# Patient Record
Sex: Male | Born: 1940 | Race: White | Hispanic: No | Marital: Married | Smoking: Former smoker
Health system: Southern US, Community
[De-identification: ages and names within clinical notes are randomized; demographics above are authoritative.]

## PROBLEM LIST (undated history)

## (undated) DIAGNOSIS — Z87442 Personal history of urinary calculi: Secondary | ICD-10-CM

## (undated) DIAGNOSIS — R0683 Snoring: Secondary | ICD-10-CM

## (undated) DIAGNOSIS — K219 Gastro-esophageal reflux disease without esophagitis: Secondary | ICD-10-CM

## (undated) DIAGNOSIS — I10 Essential (primary) hypertension: Secondary | ICD-10-CM

## (undated) DIAGNOSIS — E119 Type 2 diabetes mellitus without complications: Secondary | ICD-10-CM

## (undated) DIAGNOSIS — I499 Cardiac arrhythmia, unspecified: Secondary | ICD-10-CM

## (undated) DIAGNOSIS — N210 Calculus in bladder: Secondary | ICD-10-CM

## (undated) DIAGNOSIS — H9190 Unspecified hearing loss, unspecified ear: Secondary | ICD-10-CM

## (undated) DIAGNOSIS — Z974 Presence of external hearing-aid: Secondary | ICD-10-CM

## (undated) DIAGNOSIS — IMO0001 Reserved for inherently not codable concepts without codable children: Secondary | ICD-10-CM

## (undated) DIAGNOSIS — Z889 Allergy status to unspecified drugs, medicaments and biological substances status: Secondary | ICD-10-CM

## (undated) DIAGNOSIS — I38 Endocarditis, valve unspecified: Secondary | ICD-10-CM

## (undated) DIAGNOSIS — M48061 Spinal stenosis, lumbar region without neurogenic claudication: Secondary | ICD-10-CM

## (undated) DIAGNOSIS — M1990 Unspecified osteoarthritis, unspecified site: Secondary | ICD-10-CM

## (undated) DIAGNOSIS — J310 Chronic rhinitis: Secondary | ICD-10-CM

## (undated) HISTORY — PX: TONSILLECTOMY: SUR1361

## (undated) HISTORY — PX: BLADDER STONE REMOVAL: SHX568

## (undated) HISTORY — PX: LITHOTRIPSY: SUR834

## (undated) HISTORY — PX: CYSTOSCOPY: SUR368

## (undated) HISTORY — PX: OTHER SURGICAL HISTORY: SHX169

---

## 2000-07-03 ENCOUNTER — Encounter: Payer: Self-pay | Admitting: Neurosurgery

## 2000-07-03 ENCOUNTER — Ambulatory Visit (HOSPITAL_COMMUNITY): Admission: RE | Admit: 2000-07-03 | Discharge: 2000-07-03 | Payer: Self-pay | Admitting: Neurosurgery

## 2007-06-11 ENCOUNTER — Ambulatory Visit (HOSPITAL_BASED_OUTPATIENT_CLINIC_OR_DEPARTMENT_OTHER): Admission: RE | Admit: 2007-06-11 | Discharge: 2007-06-11 | Payer: Self-pay | Admitting: Urology

## 2010-06-14 NOTE — Op Note (Signed)
NAMERONN, SMOLINSKY             ACCOUNT NO.:  [id number]   MEDICAL RECORD NO.:  [id number]          PATIENT TYPE:  AMB   LOCATION:  [facility]                         FACILITY:  [facility]   PHYSICIAN:  Ronald L. Earlene Plater, M.D.  DATE OF BIRTH:  1940/12/12   DATE OF PROCEDURE:  06/11/2007  DATE OF DISCHARGE:                               OPERATIVE REPORT   PREOPERATIVE DIAGNOSIS:  Bladder stone.   POSTOPERATIVE DIAGNOSIS:  Bladder stone.   PROCEDURE PERFORMED:  Cystoscopy, cystolitholapaxy (EHL)   SURGEON:  Ronald L. Earlene Plater, M.D.   ASSISTANT:  Melina Schools, M.D.   ANESTHESIA:  General.   INDICATIONS FOR PROCEDURE:  Mr. Traynham is a 70 year old male who has a  history of microscopic hematuria.  He underwent cystoscopy which  revealed a 2.5 cm jackstone.  The patient did not want an open procedure  and therefore, an EHL cystolitholapaxy was offered.   DESCRIPTION OF PROCEDURE:  The patient brought to the operating room and  identified by his arm band. Informed consent was verified and  preoperative time-out performed.  After successful induction of general  anesthesia, the patient was moved to the dorsal lithotomy position where  all appropriate pressure points were padded to avoid compression  compartment syndrome.  The perineum was prepped and draped, the surgeons  were gowned and gloved.  A 25-French cystoscopic sheath was used to  introduce a 30 degree cystoscopic lens transurethrally into the bladder.  Pancystourethroscopy was performed.  Bilateral ureteral orifices were  noted to be in normal anatomic position along the trigone.  Each was  seen to efflux clear urine.  A 2 cm multifaceted jackstone was seen at  the base of the bladder.  It was fully mobile.  There are no associated  foreign objects.  Resectoscope was then inserted and the  electrohydraulic lithotrite was used to fragment the stone.  Once it was  sufficiently fragmented, the bladder was irrigated with Toomey syringe.  It was inspected and noted to be fragment free.  Foley catheter was  placed.  The procedure was terminated.     ______________________________  Melina Schools, MD      Lucrezia Starch. Earlene Plater, M.D.  Electronically Signed   JR/MEDQ  D:  06/11/2007  T:  06/11/2007  Job:  [id number]

## 2012-08-12 ENCOUNTER — Other Ambulatory Visit: Payer: Self-pay | Admitting: Urology

## 2012-08-13 ENCOUNTER — Encounter (HOSPITAL_COMMUNITY): Payer: Self-pay | Admitting: Pharmacy Technician

## 2012-08-14 ENCOUNTER — Ambulatory Visit (HOSPITAL_COMMUNITY)
Admission: RE | Admit: 2012-08-14 | Discharge: 2012-08-14 | Disposition: A | Discharge status: Home / Self Care | Payer: Medicare Other | Source: Ambulatory Visit | Attending: Urology | Admitting: Urology

## 2012-08-14 ENCOUNTER — Encounter (HOSPITAL_COMMUNITY)
Admission: RE | Admit: 2012-08-14 | Discharge: 2012-08-14 | Disposition: A | Discharge status: Home / Self Care | Payer: Medicare Other | Source: Ambulatory Visit | Attending: Urology | Admitting: Urology

## 2012-08-14 ENCOUNTER — Encounter (HOSPITAL_COMMUNITY): Payer: Self-pay

## 2012-08-14 DIAGNOSIS — N210 Calculus in bladder: Secondary | ICD-10-CM | POA: Insufficient documentation

## 2012-08-14 DIAGNOSIS — M47814 Spondylosis without myelopathy or radiculopathy, thoracic region: Secondary | ICD-10-CM | POA: Insufficient documentation

## 2012-08-14 DIAGNOSIS — D739 Disease of spleen, unspecified: Secondary | ICD-10-CM | POA: Insufficient documentation

## 2012-08-14 DIAGNOSIS — Z01818 Encounter for other preprocedural examination: Secondary | ICD-10-CM | POA: Insufficient documentation

## 2012-08-14 HISTORY — DX: Personal history of urinary calculi: Z87.442

## 2012-08-14 HISTORY — DX: Type 2 diabetes mellitus without complications: E11.9

## 2012-08-14 HISTORY — DX: Endocarditis, valve unspecified: I38

## 2012-08-14 HISTORY — DX: Essential (primary) hypertension: I10

## 2012-08-14 HISTORY — DX: Cardiac arrhythmia, unspecified: I49.9

## 2012-08-14 LAB — BASIC METABOLIC PANEL
Calcium: 10 mg/dL (ref 8.4–10.5)
Creatinine, Ser: 0.89 mg/dL (ref 0.50–1.35)
GFR calc Af Amer: >90 mL/min (ref >90)

## 2012-08-14 LAB — CBC
MCH: 31.2 pg (ref 26.0–34.0)
MCV: 92.8 fL (ref 78.0–100.0)
Platelets: 210 10*3/uL (ref 150–400)
RDW: 13.3 % (ref 11.5–15.5)

## 2012-08-14 NOTE — Progress Notes (Signed)
lov note and ekg dr Garner Nash [facility] on chart

## 2012-08-14 NOTE — Progress Notes (Signed)
02-01-2012 ekg/lov  dr Garner [facility] on chart

## 2012-08-14 NOTE — Progress Notes (Signed)
Pt allergic to cipro=muscle weakness, cipro ordered pre op.

## 2012-08-14 NOTE — Progress Notes (Signed)
Called Frank Hill and left message cipro =muscle weakness, cipro ordered pre op

## 2012-08-14 NOTE — Progress Notes (Signed)
08/14/12 1313  OBSTRUCTIVE SLEEP APNEA  Have you ever been diagnosed with sleep apnea through a sleep study? No  Do you snore loudly (loud enough to be heard through closed doors)?  1  Do you often feel tired, fatigued, or sleepy during the daytime? 0  Has anyone observed you stop breathing during your sleep? 1  Do you have, or are you being treated for high blood pressure? 1  BMI more than 35 kg/m2? 0  Age over 72 years old? 1  Neck circumference greater than 40 cm/18 inches? 0  Gender: 1  Obstructive Sleep Apnea Score 5  Score 4 or greater  Results sent to PCP

## 2012-08-14 NOTE — Progress Notes (Signed)
Faxed dr Annabell Howells  By epicccipro is muscle weakness, cipro ordered pre op

## 2012-08-14 NOTE — Patient Instructions (Addendum)
20 Brit Wernette  08/14/2012   Your procedure is scheduled on: 07-16-2012  Report to [facility] at 600 AM.  Call this number if you have problems the morning of surgery [phone]   Remember:   Do not eat food or drink liquids :After Midnight.     Take these medicines the morning of surgery with A SIP OF WATER: fluticasone nasal spray, sotalol, ranitidne                                SEE [facility] PREPARING FOR SURGERY SHEET   Do not wear jewelry, make-up or nail polish.  Do not wear lotions, powders, or perfumes. You may wear deodorant.   Men may shave face and neck.  Do not bring valuables to the hospital. [facility] IS NOT RESPONSIBLE FOR VALUEABLES.  Contacts, dentures or bridgework may not be worn into surgery.  Leave suitcase in the car. After surgery it may be brought to your room.  For patients admitted to the hospital, checkout time is 11:00 AM the day of discharge.   Patients discharged the day of surgery will not be allowed to drive home. Louise wife  Name and phone number of your [phone]  Special Instructions: N/A   Please read over the following fact sheets that you were given: MRSA Information.  Call Cain Sieve RN pre op nurse if needed [phone]    FAILURE TO FOLLOW THESE INSTRUCTIONS MAY RESULT IN THE CANCELLATION OF YOUR SURGERY.  PATIENT SIGNATURE___________________________________________  NURSE SIGNATURE_____________________________________________

## 2012-08-14 NOTE — H&P (Signed)
ctive Problems Problems  1. Benign Prostatic Hypertrophy 600.00 2. Bladder Calculus 594.1 3. 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 Proximal Ureteral Stone On The Left 592.1  History of Present Illness     Frank Hill is a former patient of Dr. Earlene Plater with a history of stones.  He also has BPH wtih some PSA elevation.  He presents today with the onset yesterday at 4:30 of intermittantly severe left flank pain with nausea and vomiting.  He has had several stones and past  some and have both ureteroscopy and ESWL.  Now he feels like the stone has passed into the bladder.  He has no pain or bladder symptoms now.  His stones have been Calcium and Uric acid.  The last was pure Ca Ox.   He was given Metformin at one point and he had multiple small stones.  He has not had a bad stone in 15 years but did have a 1in bladder stone in 2009.   He has a history of hypogonadism and was on TRT briefly and his PSA went up from around 2.9 to 3.4.   He stopped the med and it dropped to 3.0.  The most recent level was 3.3.  He has a history of prostatitis.  He had a biopsy 10-15 years ago for a PSA of 12 and it was negative.   Past Medical History Problems  1. History of  Anxiety (Symptom) 300.00 2. History of  Arthritis V13.4 3. History of  Diabetes Mellitus 250.00 4. History of  Dysuria 788.1 5. History of  Heartburn With Regurgitation 787.1 6. History of  Hypercholesterolemia 272.0 7. History of  Hypertension 401.9 8. History of  Prostatitis 601.9  Surgical History Problems  1. History of  Cystoscopy With Fragmentation Of Bladder Calculus Over 2.5cm 2. History of  Lithotripsy 3. History of  Tonsillectomy  Current Meds 1. Aspirin 81 MG Oral Tablet; Therapy: (Recorded:23Apr2009) to 2. Lisinopril TABS; Therapy: (Recorded:23Apr2009) to 3. Multi-Vitamin TABS; Therapy: (Recorded:23Apr2009) to 4. Ranitidine HCl 15 MG/ML Oral Syrup; Therapy: (Recorded:23Apr2009) to 5. Sotalol HCl 120 MG Oral  Tablet; Therapy: 02May2014 to  Allergies Medication  1. Cipro TABS 2. Penicillins  Family History Problems  1. Family history of  Death In The Family Father 2. Family history of  Death In The Family Mother 3. Maternal history of  Diabetes Mellitus V18.0 4. Family history of  Family Health Status - Father's Age Died at 78 from heart failure 5. Family history of  Family Health Status - Mother's Age Died at 58 from infection 6. Family history of  Family Health Status Number Of Children 1 son and daughter 59. Paternal history of  Heart Disease V17.49 8. Fraternal history of  Heart Disease V17.49 9. Paternal grandfather's history of  Stroke Syndrome V17.1  Social History Problems    Caffeine Use 4 cups a day   Chewing Nicotine-containing Substances   History of  Former Smoker   Marital History - Currently Married   Occupation: retired   Tobacco Use V15.82 chew tobacco Denied    History of  Alcohol Use   Past, family and social history reviewed and updated.   Review of Systems Genitourinary, constitutional, skin, eye, otolaryngeal, hematologic/lymphatic, cardiovascular, pulmonary, endocrine, musculoskeletal, gastrointestinal, neurological and psychiatric system(s) were reviewed and pertinent findings if present are noted.  Genitourinary: urinary frequency, feelings of urinary urgency, nocturia, incontinence, difficulty starting the urinary stream, urinary stream starts and stops and erectile dysfunction.  Gastrointestinal: nausea and vomiting.  Musculoskeletal:  joint pain.    Vitals Vital Signs [Data Includes: Last 1 Day]  14Jul2014 01:36PM  BMI Calculated: 29.62 BSA Calculated: 2.06 Height: 5 ft 9 in Weight: 200 lb  Blood Pressure: 148 / 82 Temperature: 98.9 F Heart Rate: 68  Physical Exam Constitutional: Well nourished and well developed . No acute distress.  ENT:. The ears and nose are normal in appearance.  Neck: The appearance of the neck is normal and  no neck mass is present.  Pulmonary: No respiratory distress and normal respiratory rhythm and effort.  Cardiovascular: Heart rate and rhythm are normal . No peripheral edema.  Abdomen: The abdomen is mildly obese. The abdomen is soft and nontender. No masses are palpated. No CVA tenderness. No hernias are palpable. No hepatosplenomegaly noted.  Rectal: Rectal exam demonstrates normal sphincter tone, no tenderness and no masses. Estimated prostate size is 3+. The prostate has no nodularity and is not tender. The left seminal vesicle is nonpalpable. The right seminal vesicle is nonpalpable. The perineum is normal on inspection.  Genitourinary: Examination of the penis demonstrates no discharge, no masses, no lesions and a normal meatus. The scrotum is without lesions. The right epididymis is palpably normal and non-tender. The left epididymis is palpably normal and non-tender. The right testis is non-tender and without masses. The left testis is non-tender and without masses.  Lymphatics: The femoral and inguinal nodes are not enlarged or tender.  Skin: Normal skin turgor, no visible rash and no visible skin lesions.  Neuro/Psych:. Mood and affect are appropriate. Normal sensation of the perineum/perianal region (S3,4,5).    Results/Data Urine [Data Includes: Last 1 Day]   14Jul2014  COLOR YELLOW   APPEARANCE CLEAR   SPECIFIC GRAVITY 1.025   pH 5.5   GLUCOSE NEG mg/dL  BILIRUBIN NEG   KETONE NEG mg/dL  BLOOD LARGE   PROTEIN TRACE mg/dL  UROBILINOGEN 0.2 mg/dL  NITRITE NEG   LEUKOCYTE ESTERASE NEG   SQUAMOUS EPITHELIAL/HPF RARE   WBC 0-2 WBC/hpf  RBC 7-10 RBC/hpf  BACTERIA FEW   CRYSTALS NONE SEEN   CASTS NONE SEEN    Old records or history reviewed: Prior office notes and records reviewed.  The following images/tracing/specimen were independently visualized:  KUB today shows a possible 8mm stone in the left proximal ureter but it could be bowel content. There is a 2cm jack stone in  the right bladder. The film is otherwise unremarkable. CT urogram confirms an 8x58mm left proximal stone with obstruction and a 2cm jack stone which is now in the left bladder. full report pending.    Assessment Assessed  1. Proximal Ureteral Stone On The Left 592.1 2. Bladder Calculus 594.1   He has an 8x35mm left proximal stone with obstruction and a 2.2cm Jack stone in the bladder.  He is assymptomatic at this time.   Plan Bladder Calculus (594.1), Proximal Ureteral Stone On The Left (592.1)  1. Follow-up Schedule Surgery Office  Follow-up  Done: 14Jul2014 Nephrolithiasis (592.0)  2. AU CT-STONE PROTOCOL  Done: 14Jul2014 12:00AM 3. KUB  Done: 14Jul2014 12:00AM Proximal Ureteral Stone On The Left (592.1)  4. Oxycodone-Acetaminophen 5-325 MG Oral Tablet; take 1 or 2 tablets q 4-6 hours prn pain;  Therapy: 14Jul2014 to (Last Rx:14Jul2014)   I will give him percocet to keep on hand. He will be set up for cystolithalopaxy and left ureteroscopic stone extraction with holmium and stent.  I reviewed the risks of bleeding, infection, ureteral and bladder injury, need for stent and secondary procedures,  thrombotic events and anesthetic risks.

## 2012-08-15 ENCOUNTER — Ambulatory Visit (HOSPITAL_COMMUNITY)
Admission: RE | Admit: 2012-08-15 | Discharge: 2012-08-15 | Disposition: A | Discharge status: Home / Self Care | Payer: Medicare Other | Source: Ambulatory Visit | Attending: Urology | Admitting: Urology

## 2012-08-15 ENCOUNTER — Encounter (HOSPITAL_COMMUNITY): Payer: Self-pay | Admitting: Anesthesiology

## 2012-08-15 ENCOUNTER — Encounter (HOSPITAL_COMMUNITY): Payer: Self-pay | Admitting: *Deleted

## 2012-08-15 ENCOUNTER — Ambulatory Visit (HOSPITAL_COMMUNITY): Payer: Medicare Other | Admitting: Anesthesiology

## 2012-08-15 ENCOUNTER — Encounter (HOSPITAL_COMMUNITY)
Admission: RE | Disposition: A | Discharge status: Home / Self Care | Payer: Self-pay | Source: Ambulatory Visit | Attending: Urology

## 2012-08-15 DIAGNOSIS — E7800 Pure hypercholesterolemia, unspecified: Secondary | ICD-10-CM | POA: Insufficient documentation

## 2012-08-15 DIAGNOSIS — Z7982 Long term (current) use of aspirin: Secondary | ICD-10-CM | POA: Insufficient documentation

## 2012-08-15 DIAGNOSIS — N529 Male erectile dysfunction, unspecified: Secondary | ICD-10-CM | POA: Insufficient documentation

## 2012-08-15 DIAGNOSIS — N201 Calculus of ureter: Secondary | ICD-10-CM | POA: Insufficient documentation

## 2012-08-15 DIAGNOSIS — N210 Calculus in bladder: Secondary | ICD-10-CM | POA: Insufficient documentation

## 2012-08-15 DIAGNOSIS — I10 Essential (primary) hypertension: Secondary | ICD-10-CM | POA: Insufficient documentation

## 2012-08-15 DIAGNOSIS — E119 Type 2 diabetes mellitus without complications: Secondary | ICD-10-CM | POA: Insufficient documentation

## 2012-08-15 DIAGNOSIS — N400 Enlarged prostate without lower urinary tract symptoms: Secondary | ICD-10-CM | POA: Insufficient documentation

## 2012-08-15 DIAGNOSIS — Z79899 Other long term (current) drug therapy: Secondary | ICD-10-CM | POA: Insufficient documentation

## 2012-08-15 HISTORY — PX: CYSTOSCOPY WITH RETROGRADE PYELOGRAM, URETEROSCOPY AND STENT PLACEMENT: SHX5789

## 2012-08-15 HISTORY — PX: HOLMIUM LASER APPLICATION: SHX5852

## 2012-08-15 LAB — GLUCOSE, CAPILLARY: Glucose-Capillary: 139 mg/dL — ABNORMAL HIGH (ref 70–99)

## 2012-08-15 SURGERY — CYSTOURETEROSCOPY, WITH RETROGRADE PYELOGRAM AND STENT INSERTION
Anesthesia: General | Laterality: Left | Wound class: Clean Contaminated

## 2012-08-15 MED ORDER — MIDAZOLAM HCL 5 MG/5ML IJ SOLN
INTRAMUSCULAR | Status: DC | PRN
  Administered 2012-08-15: 2 mg via INTRAVENOUS

## 2012-08-15 MED ORDER — SODIUM CHLORIDE 0.9 % IV SOLN: 250.0000 mL | INTRAVENOUS | Status: DC | PRN

## 2012-08-15 MED ORDER — FENTANYL CITRATE 0.05 MG/ML IJ SOLN
INTRAMUSCULAR | Status: DC | PRN
  Administered 2012-08-15: 25 ug via INTRAVENOUS
  Administered 2012-08-15: 50 ug via INTRAVENOUS
  Administered 2012-08-15: 25 ug via INTRAVENOUS
  Administered 2012-08-15: 50 ug via INTRAVENOUS

## 2012-08-15 MED ORDER — CIPROFLOXACIN IN D5W 400 MG/200ML IV SOLN: 400.0000 mg | INTRAVENOUS | Status: DC

## 2012-08-15 MED ORDER — 0.9 % SODIUM CHLORIDE (POUR BTL) OPTIME
TOPICAL | Status: DC | PRN
  Administered 2012-08-15: 1000 mL

## 2012-08-15 MED ORDER — HYOSCYAMINE SULFATE 0.125 MG SL SUBL: 0.1250 mg | SUBLINGUAL_TABLET | SUBLINGUAL | Status: DC | PRN

## 2012-08-15 MED ORDER — PROMETHAZINE HCL 25 MG/ML IJ SOLN: 6.2500 mg | INTRAMUSCULAR | Status: DC | PRN

## 2012-08-15 MED ORDER — PHENAZOPYRIDINE HCL 100 MG PO TABS
100.0000 mg | ORAL_TABLET | ORAL | Status: AC
  Administered 2012-08-15: 100 mg via ORAL
  Filled 2012-08-15: qty 1

## 2012-08-15 MED ORDER — IOHEXOL 300 MG/ML  SOLN
INTRAMUSCULAR | Status: DC | PRN
  Administered 2012-08-15: 50 mL

## 2012-08-15 MED ORDER — ONDANSETRON HCL 4 MG/2ML IJ SOLN: 4.0000 mg | Freq: Four times a day (QID) | INTRAMUSCULAR | Status: DC | PRN

## 2012-08-15 MED ORDER — BELLADONNA ALKALOIDS-OPIUM 16.2-60 MG RE SUPP
RECTAL | Status: AC
  Filled 2012-08-15: qty 1

## 2012-08-15 MED ORDER — IOHEXOL 300 MG/ML  SOLN
INTRAMUSCULAR | Status: AC
  Filled 2012-08-15: qty 1

## 2012-08-15 MED ORDER — ACETAMINOPHEN 650 MG RE SUPP
650.0000 mg | RECTAL | Status: DC | PRN
  Filled 2012-08-15: qty 1

## 2012-08-15 MED ORDER — HYDROMORPHONE HCL PF 1 MG/ML IJ SOLN
0.2500 mg | INTRAMUSCULAR | Status: DC | PRN
  Administered 2012-08-15: 0.5 mg via INTRAVENOUS

## 2012-08-15 MED ORDER — HYDROMORPHONE HCL PF 1 MG/ML IJ SOLN
INTRAMUSCULAR | Status: AC
  Filled 2012-08-15: qty 1

## 2012-08-15 MED ORDER — SODIUM CHLORIDE 0.9 % IJ SOLN: 3.0000 mL | Freq: Two times a day (BID) | INTRAMUSCULAR | Status: DC

## 2012-08-15 MED ORDER — ACETAMINOPHEN 325 MG PO TABS: 650.0000 mg | ORAL_TABLET | ORAL | Status: DC | PRN

## 2012-08-15 MED ORDER — BELLADONNA ALKALOIDS-OPIUM 16.2-60 MG RE SUPP
RECTAL | Status: DC | PRN
  Administered 2012-08-15: 1 via RECTAL

## 2012-08-15 MED ORDER — CEFAZOLIN SODIUM-DEXTROSE 2-3 GM-% IV SOLR
INTRAVENOUS | Status: AC
  Filled 2012-08-15: qty 50

## 2012-08-15 MED ORDER — ONDANSETRON HCL 4 MG/2ML IJ SOLN
INTRAMUSCULAR | Status: DC | PRN
  Administered 2012-08-15: 4 mg via INTRAVENOUS

## 2012-08-15 MED ORDER — PHENYLEPHRINE HCL 10 MG/ML IJ SOLN
20.0000 mg | INTRAVENOUS | Status: DC | PRN
  Administered 2012-08-15: 15 ug/min via INTRAVENOUS

## 2012-08-15 MED ORDER — PHENYLEPHRINE HCL 10 MG/ML IJ SOLN
INTRAMUSCULAR | Status: DC | PRN
  Administered 2012-08-15: 40 ug via INTRAVENOUS
  Administered 2012-08-15: 80 ug via INTRAVENOUS

## 2012-08-15 MED ORDER — SUCCINYLCHOLINE CHLORIDE 20 MG/ML IJ SOLN
INTRAMUSCULAR | Status: DC | PRN
  Administered 2012-08-15: 100 mg via INTRAVENOUS

## 2012-08-15 MED ORDER — FENTANYL CITRATE 0.05 MG/ML IJ SOLN: 25.0000 ug | INTRAMUSCULAR | Status: DC | PRN

## 2012-08-15 MED ORDER — LIDOCAINE HCL (CARDIAC) 20 MG/ML IV SOLN
INTRAVENOUS | Status: DC | PRN
  Administered 2012-08-15: 100 mg via INTRAVENOUS

## 2012-08-15 MED ORDER — SODIUM CHLORIDE 0.9 % IJ SOLN: 3.0000 mL | INTRAMUSCULAR | Status: DC | PRN

## 2012-08-15 MED ORDER — PROPOFOL 10 MG/ML IV BOLUS
INTRAVENOUS | Status: DC | PRN
  Administered 2012-08-15: 50 mg via INTRAVENOUS
  Administered 2012-08-15: 40 mg via INTRAVENOUS
  Administered 2012-08-15: 180 mg via INTRAVENOUS

## 2012-08-15 MED ORDER — LACTATED RINGERS IV SOLN: INTRAVENOUS | Status: DC

## 2012-08-15 MED ORDER — CEFAZOLIN SODIUM-DEXTROSE 2-3 GM-% IV SOLR
2.0000 g | INTRAVENOUS | Status: AC
  Administered 2012-08-15: 2 g via INTRAVENOUS

## 2012-08-15 MED ORDER — PHENAZOPYRIDINE HCL 200 MG PO TABS: 200.0000 mg | ORAL_TABLET | Freq: Three times a day (TID) | ORAL | Status: DC | PRN

## 2012-08-15 MED ORDER — OXYCODONE HCL 5 MG PO TABS: 5.0000 mg | ORAL_TABLET | ORAL | Status: DC | PRN

## 2012-08-15 MED ORDER — SODIUM CHLORIDE 0.9 % IR SOLN
Status: DC | PRN
  Administered 2012-08-15: 3000 mL
  Administered 2012-08-15: 6000 mL

## 2012-08-15 MED ORDER — EPHEDRINE SULFATE 50 MG/ML IJ SOLN
INTRAMUSCULAR | Status: DC | PRN
  Administered 2012-08-15: 5 mg via INTRAVENOUS

## 2012-08-15 MED ORDER — GLYCOPYRROLATE 0.2 MG/ML IJ SOLN
INTRAMUSCULAR | Status: DC | PRN
  Administered 2012-08-15: 0.2 mg via INTRAVENOUS

## 2012-08-15 MED ORDER — SOTALOL HCL 120 MG PO TABS
120.0000 mg | ORAL_TABLET | Freq: Two times a day (BID) | ORAL | Status: DC
  Administered 2012-08-15: 80 mg via ORAL
  Administered 2012-08-15: 07:00:00 via ORAL
  Administered 2012-08-15: 80 mg via ORAL
  Filled 2012-08-15: qty 1

## 2012-08-15 MED ORDER — LACTATED RINGERS IV SOLN
INTRAVENOUS | Status: DC | PRN
  Administered 2012-08-15: 08:00:00 via INTRAVENOUS

## 2012-08-15 SURGICAL SUPPLY — 21 items
BAG URO CATCHER STRL LF (DRAPE) ×2 IMPLANT
BASKET ZERO TIP NITINOL 2.4FR (BASKET) ×1 IMPLANT
BSKT STON RTRVL ZERO TP 2.4FR (BASKET) ×1
CATH URET 5FR 28IN OPEN ENDED (CATHETERS) ×2 IMPLANT
CLOTH BEACON ORANGE TIMEOUT ST (SAFETY) ×2 IMPLANT
DRAPE CAMERA CLOSED 9X96 (DRAPES) ×2 IMPLANT
FIBER LASER TRAC TIP (UROLOGICAL SUPPLIES) IMPLANT
GLOVE SURG SS PI 8.0 STRL IVOR (GLOVE) ×2 IMPLANT
GOWN PREVENTION PLUS XLARGE (GOWN DISPOSABLE) ×1 IMPLANT
GOWN STRL REIN XL XLG (GOWN DISPOSABLE) ×3 IMPLANT
GUIDEWIRE STR DUAL SENSOR (WIRE) ×2 IMPLANT
LASER FIBER DISP (UROLOGICAL SUPPLIES) ×1 IMPLANT
LASER FIBER DISP 1000U (UROLOGICAL SUPPLIES) ×1 IMPLANT
MANIFOLD NEPTUNE II (INSTRUMENTS) ×2 IMPLANT
MARKER SKIN DUAL TIP RULER LAB (MISCELLANEOUS) ×2 IMPLANT
PACK CYSTO (CUSTOM PROCEDURE TRAY) ×2 IMPLANT
SHEATH ACCESS URETERAL 38CM (SHEATH) ×1 IMPLANT
STENT CONTOUR 6FRX26X.038 (STENTS) ×1 IMPLANT
SYR 50ML LL SCALE MARK (SYRINGE) ×1 IMPLANT
SYRINGE 10CC LL (SYRINGE) ×1 IMPLANT
TUBING CONNECTING 10 (TUBING) ×2 IMPLANT

## 2012-08-15 NOTE — Op Note (Signed)
NAMEDARVIS, CROFT NO.:  [id number]  MEDICAL RECORD NO.:  [id number]  LOCATION:  [facility]                         FACILITY:  [facility]  PHYSICIAN:  Excell Seltzer. Annabell Howells, M.D.    DATE OF BIRTH:  [birth year]  DATE OF PROCEDURE:  08/15/2012 DATE OF DISCHARGE:  08/15/2012                              OPERATIVE REPORT   PROCEDURE:  Cystolitholapaxy for a 1.7 cm bladder stone and left retrograde pyelogram with interpretation, left  ureteroscopy with holmium lasertripsy, stone extraction, and left ureteral stent insertion for a 1.2 cm left proximal ureteral stone.  PREOPERATIVE DIAGNOSIS:  A 1.7 cm bladder stone and 1.2 cm left proximal ureteral stone.  POSTOPERATIVE DIAGNOSIS:  A 1.7 cm bladder stone and 1.2 cm left proximal ureteral stone.  PROCEDURE:  Left retrograde pyelogram and interpretation.  SURGEON:  Excell Seltzer. Annabell Howells, MD  ANESTHESIA:  General.  SPECIMEN:  Stone fragments.  BLOOD LOSS:  Minimal.  DRAINS:  A 6-French 26-cm left double-J stent.  COMPLICATIONS:  None.  INDICATIONS:  Mr. Perfect is a 72 year old white male with a symptomatic 1.2 cm left proximal ureteral stone and a 1.7 cm bladder stone.  It was felt that cystolitholapaxy and ureteroscopic stone extraction were indicated.  FINDINGS OF PROCEDURE:  He was taken to the operating room, where general anesthetic was induced.  He was given 2 g of Ancef.  He was placed in lithotomy position and fitted with PAS hose.  His perineum and genitalia were prepped with Betadine solution and was draped in usual sterile fashion.  Cystoscopy was performed using a 22-French scope and 12 and 70 degree lenses.  Examination revealed a normal urethra.  The external sphincter was intact.  The prostatic urethra was approximately 3 cm in length with trilobar hyperplasia with obstruction.  The middle lobe was small.  The bladder had mild trabeculation.  No tumors were noted.  Ureteral orifices were unremarkable, but there  was a 1.7 cm jack stone at the base of the bladder.  Once inspection had been performed, a 1000 micron laser fiber was passed through the scope.  Holmium laser was initially set on 1 joule and 5 hertz that was increased at later point to 2 joule and 5 hertz.  The stone was fragmented into manageable pieces which were then evacuated from the bladder.  Once all the bladder stone fragments had been removed, a 5-French open- end catheter was placed through the cystoscope and a left retrograde pyelogram was performed.  The contrast was instilled.   This revealed slight J hooking of the distal ureter with an otherwise normal caliber ureter up to the stone at approximately L4.  Contrast would not readily flow by the stone.  At this point, a guidewire was passed through the open end catheter and was negotiated by the stone into the kidney.  A 12-French introducer sheath dilator core was placed over the wire to dilate the ureter to the level of the stone that was removed and a 6.4-French short ureteroscope was then passed alongside the wire.  I was not able to get above the distal ureter due to an S shaped ureter.  At this point, a 2nd guidewire was passed and  advanced by the stone and the 38 cm digital access sheath was reassembled and inserted over the wire to just below the stone.  The inner core and wire were removed and the digital flexible ureteroscope was then inserted to the level of the kidney.  The kidney could be visualized.  There was a little kink right at the stone which made it somewhat more difficult, but I was able to appropriately visualize the stone.  Initially I used the smooth tip 200 micron laser fiber, but the tip of the fiber appeared to be laminated. This was removed and then I went back to the regular 200 micron fiber set on 0.5 joules and 10 hertz.  At this setting, the stone readily fragmented.  It was broken up into manageable pieces and  intermittently fragments would be removed with a ZeroTip basket.  Eventually the stone fragmented to point where I could pass the scope above the stone into the proximal ureter and kidney.  Once this was done, the residual fragments removed with the basket.  Final inspection revealed some small residual fragments that were felt to be sufficiently small to pass. There was some irritation to the ureter at the level of the stone.  It was felt a stent was indicated.  The access sheath was then removed.  The cystoscope was reinserted over the wire and a 6-French 26 cm double-J stent was passed to the kidney under fluoroscopic guidance.  The wire was removed leaving good coil in the kidney and good coil in the bladder.  Spot film revealed no obvious calcifications in the kidney or proximal ureter.  The bladder was drained.  The scope was removed.  The patient was taken down from lithotomy position after placement of a B and O suppository.  His anesthetic was reversed and he was moved to recovery room in stable condition.  There were no complications.     Excell Seltzer. Annabell Howells, M.D.     JJW/MEDQ  D:  08/15/2012  T:  08/15/2012  Job:  [id number]

## 2012-08-15 NOTE — Transfer of Care (Signed)
Immediate Anesthesia Transfer of Care Note  Patient: Frank Hill  Procedure(s) Performed: Procedure(s): CYSTOSCOPY LITHIOPEXY, URETEROSCOPY STONE EXTRACTION WITH HOLMIUM LASER AND STENT PLACEMENT (Left) HOLMIUM LASER APPLICATION (Left)  Patient Location: PACU  Anesthesia Type:General  Level of Consciousness: awake, alert , oriented and patient cooperative  Airway & Oxygen Therapy: Patient Spontanous Breathing and Patient connected to face mask oxygen  Post-op Assessment: Report given to PACU RN, Post -op Vital signs reviewed and stable and Patient moving all extremities  Post vital signs: Reviewed and stable  Complications: No apparent anesthesia complications

## 2012-08-15 NOTE — Interval H&P Note (Signed)
History and Physical Interval Note:  08/15/2012 8:17 AM  Frank Hill  has presented today for surgery, with the diagnosis of BLADDER STONE AND LEFT PROXIMAL STONE  The various methods of treatment have been discussed with the patient and family. After consideration of risks, benefits and other options for treatment, the patient has consented to  Procedure(s): CYSTOSCOPY LITHIOPEXY, URETEROSCOPY STONE EXTRACTION WITH HOLMIUM LASER AND STENT PLACEMENT (Left) HOLMIUM LASER APPLICATION (Left) as a surgical intervention .  The patient's history has been reviewed, patient examined, no change in status, stable for surgery.  I have reviewed the patient's chart and labs.  Questions were answered to the patient's satisfaction.     Kasyn Stouffer J

## 2012-08-15 NOTE — Anesthesia Postprocedure Evaluation (Signed)
Anesthesia Post Note  Patient: Frank Hill  Procedure(s) Performed: Procedure(s) (LRB): CYSTOSCOPY LITHIOPEXY, URETEROSCOPY STONE EXTRACTION WITH HOLMIUM LASER AND STENT PLACEMENT (Left) HOLMIUM LASER APPLICATION (Left)  Anesthesia type: General  Patient location: PACU  Post pain: Pain level controlled  Post assessment: Post-op Vital signs reviewed  Last Vitals:  Filed Vitals:   08/15/12 1138  BP: 153/73  Pulse: 65  Temp: 36.3 C  Resp: 12    Post vital signs: Reviewed  Level of consciousness: sedated  Complications: No apparent anesthesia complications

## 2012-08-15 NOTE — Anesthesia Preprocedure Evaluation (Addendum)
Anesthesia Evaluation  Patient identified by MRN, date of birth, ID band Patient awake    Reviewed: Allergy & Precautions, H&P , NPO status , Patient's Chart, lab work & pertinent test results  History of Anesthesia Complications (+) AWARENESS UNDER ANESTHESIA  Airway Mallampati: II TM Distance: >3 FB Neck ROM: Full    Dental  (+) Teeth Intact, Chipped and Dental Advisory Given   Pulmonary former smoker,  breath sounds clear to auscultation  Pulmonary exam normal       Cardiovascular hypertension, Pt. on medications + dysrhythmias Atrial Fibrillation Rhythm:Regular Rate:Normal     Neuro/Psych negative neurological ROS  negative psych ROS   GI/Hepatic negative GI ROS, Neg liver ROS,   Endo/Other  diabetes (Diet control)  Renal/GU negative Renal ROS  negative genitourinary   Musculoskeletal negative musculoskeletal ROS (+)   Abdominal   Peds negative pediatric ROS (+)  Hematology negative hematology ROS (+)   Anesthesia Other Findings   Reproductive/Obstetrics                       Anesthesia Physical Anesthesia Plan  ASA: III  Anesthesia Plan: General   Post-op Pain Management:    Induction: Intravenous  Airway Management Planned: LMA  Additional Equipment:   Intra-op Plan:   Post-operative Plan: Extubation in OR  Informed Consent: I have reviewed the patients History and Physical, chart, labs and discussed the procedure including the risks, benefits and alternatives for the proposed anesthesia with the patient or authorized representative who has indicated his/her understanding and acceptance.   Dental advisory given  Plan Discussed with: CRNA  Anesthesia Plan Comments:         Anesthesia Quick Evaluation

## 2012-08-15 NOTE — Brief Op Note (Signed)
08/15/2012  10:33 AM  PATIENT:  Frank Hill  72 y.o. male  PRE-OPERATIVE DIAGNOSIS:  BLADDER STONE AND LEFT PROXIMAL STONE  POST-OPERATIVE DIAGNOSIS:  BLADDER STONE AND LEFT PROXIMAL STONE  PROCEDURE:  Procedure(s): CYSTOSCOPY LITHIOPEXY, URETEROSCOPY STONE EXTRACTION WITH HOLMIUM LASER AND STENT PLACEMENT (Left) HOLMIUM LASER APPLICATION (Left)  SURGEON:  Surgeon(s) and Role:    * Anner Crete, MD - Primary  PHYSICIAN ASSISTANT:   ASSISTANTS: none   ANESTHESIA:   general  EBL:  Total I/O In: 1000 [I.V.:1000] Out: -   BLOOD ADMINISTERED:none  DRAINS: 6x26 left JJ stent   LOCAL MEDICATIONS USED:  NONE  SPECIMEN:  Source of Specimen:  bladder and ureteral stone fragments.   DISPOSITION OF SPECIMEN:  to family  COUNTS:  YES  TOURNIQUET:  * No tourniquets in log *  DICTATION: .Other Dictation: Dictation Number [id number]  PLAN OF CARE: Discharge to home after PACU  PATIENT DISPOSITION:  PACU - hemodynamically stable.   Delay start of Pharmacological VTE agent (>24hrs) due to surgical blood loss or risk of bleeding: not applicable

## 2012-08-15 NOTE — Preoperative (Signed)
Beta Blockers   Reason not to administer Beta Blockers:Betapace taken 08-15-12 at [facility]

## 2012-08-16 ENCOUNTER — Emergency Department (HOSPITAL_BASED_OUTPATIENT_CLINIC_OR_DEPARTMENT_OTHER)
Admission: EM | Admit: 2012-08-16 | Discharge: 2012-08-16 | Disposition: A | Discharge status: Home / Self Care | Payer: Medicare Other | Attending: Emergency Medicine | Admitting: Emergency Medicine

## 2012-08-16 ENCOUNTER — Encounter (HOSPITAL_BASED_OUTPATIENT_CLINIC_OR_DEPARTMENT_OTHER): Payer: Self-pay | Admitting: *Deleted

## 2012-08-16 DIAGNOSIS — Z7982 Long term (current) use of aspirin: Secondary | ICD-10-CM | POA: Insufficient documentation

## 2012-08-16 DIAGNOSIS — Z87442 Personal history of urinary calculi: Secondary | ICD-10-CM | POA: Insufficient documentation

## 2012-08-16 DIAGNOSIS — R34 Anuria and oliguria: Secondary | ICD-10-CM | POA: Insufficient documentation

## 2012-08-16 DIAGNOSIS — Z79899 Other long term (current) drug therapy: Secondary | ICD-10-CM | POA: Insufficient documentation

## 2012-08-16 DIAGNOSIS — I10 Essential (primary) hypertension: Secondary | ICD-10-CM | POA: Insufficient documentation

## 2012-08-16 DIAGNOSIS — Z87891 Personal history of nicotine dependence: Secondary | ICD-10-CM | POA: Insufficient documentation

## 2012-08-16 DIAGNOSIS — N3289 Other specified disorders of bladder: Secondary | ICD-10-CM | POA: Insufficient documentation

## 2012-08-16 DIAGNOSIS — Z9889 Other specified postprocedural states: Secondary | ICD-10-CM | POA: Insufficient documentation

## 2012-08-16 DIAGNOSIS — R350 Frequency of micturition: Secondary | ICD-10-CM | POA: Insufficient documentation

## 2012-08-16 DIAGNOSIS — Z880 Allergy status to penicillin: Secondary | ICD-10-CM | POA: Insufficient documentation

## 2012-08-16 DIAGNOSIS — Z8679 Personal history of other diseases of the circulatory system: Secondary | ICD-10-CM | POA: Insufficient documentation

## 2012-08-16 DIAGNOSIS — E119 Type 2 diabetes mellitus without complications: Secondary | ICD-10-CM | POA: Insufficient documentation

## 2012-08-16 LAB — URINALYSIS, ROUTINE W REFLEX MICROSCOPIC
Glucose, UA: NEGATIVE mg/dL
Nitrite: POSITIVE — AB
Protein, ur: 100 mg/dL — AB
pH: 6 (ref 5.0–8.0)

## 2012-08-16 LAB — URINE MICROSCOPIC-ADD ON

## 2012-08-16 MED ORDER — SULFAMETHOXAZOLE-TRIMETHOPRIM 800-160 MG PO TABS: 1.0000 | ORAL_TABLET | Freq: Two times a day (BID) | ORAL | Status: DC

## 2012-08-16 MED ORDER — BELLADONNA-OPIUM 16.2-30 MG RE SUPP: 30.0000 mg | Freq: Three times a day (TID) | RECTAL | Status: DC | PRN

## 2012-08-16 NOTE — Discharge Instructions (Signed)
Call Doctor Wrenn's office today to possibly be seen in the office later today. If you develop fever, nausea or vomiting, increased pain, return to the ER.   Possible Urinary Tract Infection Urinary tract infections (UTIs) can develop anywhere along your urinary tract. Your urinary tract is your body's drainage system for removing wastes and extra water. Your urinary tract includes two kidneys, two ureters, a bladder, and a urethra. Your kidneys are a pair of bean-shaped organs. Each kidney is about the size of your fist. They are located below your ribs, one on each side of your spine. CAUSES Infections are caused by microbes, which are microscopic organisms, including fungi, viruses, and bacteria. These organisms are so small that they can only be seen through a microscope. Bacteria are the microbes that most commonly cause UTIs. SYMPTOMS  Symptoms of UTIs may vary by age and gender of the patient and by the location of the infection. Symptoms in young women typically include a frequent and intense urge to urinate and a painful, burning feeling in the bladder or urethra during urination. Older women and men are more likely to be tired, shaky, and weak and have muscle aches and abdominal pain. A fever may mean the infection is in your kidneys. Other symptoms of a kidney infection include pain in your back or sides below the ribs, nausea, and vomiting. DIAGNOSIS To diagnose a UTI, your caregiver will ask you about your symptoms. Your caregiver also will ask to provide a urine sample. The urine sample will be tested for bacteria and white blood cells. White blood cells are made by your body to help fight infection. TREATMENT  Typically, UTIs can be treated with medication. Because most UTIs are caused by a bacterial infection, they usually can be treated with the use of antibiotics. The choice of antibiotic and length of treatment depend on your symptoms and the type of bacteria causing your  infection. HOME CARE INSTRUCTIONS  If you were prescribed antibiotics, take them exactly as your caregiver instructs you. Finish the medication even if you feel better after you have only taken some of the medication.  Drink enough water and fluids to keep your urine clear or pale yellow.  Avoid caffeine, tea, and carbonated beverages. They tend to irritate your bladder.  Empty your bladder often. Avoid holding urine for long periods of time.  Empty your bladder before and after sexual intercourse.  After a bowel movement, women should cleanse from front to back. Use each tissue only once. SEEK MEDICAL CARE IF:   You have back pain.  You develop a fever.  Your symptoms do not begin to resolve within 3 days. SEEK IMMEDIATE MEDICAL CARE IF:   You have severe back pain or lower abdominal pain.  You develop chills.  You have nausea or vomiting.  You have continued burning or discomfort with urination. MAKE SURE YOU:   Understand these instructions.  Will watch your condition.  Will get help right away if you are not doing well or get worse. Document Released: 10/26/2004 Document Revised: 07/18/2011 Document Reviewed: 02/24/2011 [facility] Patient Information 2014 [place], [place].

## 2012-08-16 NOTE — ED Provider Notes (Signed)
History    CSN: [id number] Arrival date & time 08/16/12  0713  First MD Initiated Contact with Patient 08/16/12 [phone]     Chief Complaint  Patient presents with  . unable to void, surgery yesterday    (Consider location/radiation/quality/duration/timing/severity/associated sxs/prior Treatment) HPI Comments: Patient presents to ER for evaluation of possible urinary retention. Patient reports that he had a left ureteral stent placed yesterday I was in the hospital for kidney stone. Patient says that he has not been able to urinate. In actuality, however, patient has been urinating frequently. He has been urinating small amounts every 15 minutes through the night. No fever, nausea or vomiting. He has noticed occasional blood in the urine.  Past Medical History  Diagnosis Date  . Hypertension   . Diabetes mellitus without complication     no current meds  . History of kidney stones   . Endocarditis 2-3 yrs ago  . Dysrhythmia     atrial flutter   Past Surgical History  Procedure Laterality Date  . Bladder stone removal  yrs ago  . Cystoscopy  yrs agoseveral done  . Lithotripsy  15 yrs ago    several done  . Cystolithopaxy  yrs ago  . Tonsillectomy  age 70   History reviewed. No pertinent family history. History  Substance Use Topics  . Smoking status: Former Smoker -- 1.00 packs/day for 15 years    Types: Cigarettes    Quit date: 01/30/1978  . Smokeless tobacco: Current User    Types: Chew, Snuff  . Alcohol Use: No    Review of Systems  Constitutional: Negative for fever.  Genitourinary: Positive for frequency and decreased urine volume. Negative for flank pain.  All other systems reviewed and are negative.    Allergies  Ciprofloxacin; Prednisone; Adhesive; and Penicillins  Home Medications   Current Outpatient Rx  Name  Route  Sig  Dispense  Refill  . Alpha-Lipoic Acid 200 MG TABS   Oral   Take 1 tablet by mouth 2 (two) times daily.         . AMINO ACIDS  COMPLEX PO   Oral   Take 1 tablet by mouth 2 (two) times daily.         . Ascorbic Acid (VITAMIN C) 1000 MG tablet   Oral   Take 1,000 mg by mouth daily.         Marland Kitchen aspirin EC 81 MG tablet   Oral   Take 81 mg by mouth daily.         . Calcium Carbonate-Simethicone (ROLAIDS MULTI-SYMPTOM PO)   Oral   Take 1 tablet by mouth every 6 (six) hours as needed (heartburn).         . calcium-vitamin D (OSCAL WITH D) 500-200 MG-UNIT per tablet   Oral   Take 2 tablets by mouth 2 (two) times daily.         . cholecalciferol (VITAMIN D) 1000 UNITS tablet   Oral   Take 1,000 Units by mouth daily.         . Chromium 200 MCG TABS   Oral   Take 200 mcg by mouth daily.         . Coenzyme Q10 (CO Q 10) 100 MG CAPS   Oral   Take 1 capsule by mouth 2 (two) times daily.         . fish oil-omega-3 fatty acids 1000 MG capsule   Oral   Take 2 g by mouth 2 (  two) times daily.         . fluticasone (FLONASE) 50 MCG/ACT nasal spray   Nasal   Place 2 sprays into the nose 2 (two) times daily.         . Garlic 200 MG TABS   Oral   Take 400 mg by mouth 2 (two) times daily.         . Ginkgo Biloba 120 MG TABS   Oral   Take 120 mg by mouth daily.         . Glucosamine Sulfate 1000 MG CAPS   Oral   Take 1,000 mg by mouth 2 (two) times daily.         . hyoscyamine (LEVSIN/SL) 0.125 MG SL tablet   Sublingual   Place 1 tablet (0.125 mg total) under the tongue every 4 (four) hours as needed for cramping.   30 tablet   0   . Lactobacillus (ACIDOPHILUS PO)   Oral   Take 1 tablet by mouth daily.         Marland Kitchen LECITHIN PO   Oral   Take 1 tablet by mouth 2 (two) times daily.         Marland Kitchen lisinopril (PRINIVIL,ZESTRIL) 20 MG tablet   Oral   Take 20 mg by mouth 2 (two) times daily.         . Methylsulfonylmethane (MSM) 1000 MG TABS   Oral   Take 1 tablet by mouth 2 (two) times daily.         . Multiple Vitamin (MULTIVITAMIN WITH MINERALS) TABS   Oral   Take 1  tablet by mouth daily.         . niacin (SLO-NIACIN) 500 MG tablet   Oral   Take 500 mg by mouth at bedtime.         Marland Kitchen OVER THE COUNTER MEDICATION   Oral   Take 1 tablet by mouth daily. BILLBERRY 375 MG         . oxyCODONE-acetaminophen (PERCOCET/ROXICET) 5-325 MG per tablet   Oral   Take 2 tablets by mouth every 4 (four) hours as needed for pain.         . phenazopyridine (PYRIDIUM) 200 MG tablet   Oral   Take 1 tablet (200 mg total) by mouth 3 (three) times daily as needed for pain.   30 tablet   0   . Psyllium Husk POWD   Oral   Take 60 mg by mouth at bedtime.         . ranitidine (ZANTAC) 300 MG capsule   Oral   Take 300 mg by mouth 2 (two) times daily.         . Selenium 200 MCG TABS   Oral   Take 1 tablet by mouth daily.         . sildenafil (VIAGRA) 50 MG tablet   Oral   Take 50 mg by mouth daily as needed for erectile dysfunction.         . simvastatin (ZOCOR) 20 MG tablet   Oral   Take 20 mg by mouth at bedtime.         . sotalol (BETAPACE) 120 MG tablet   Oral   Take 120 mg by mouth 2 (two) times daily.         Marland Kitchen VANADYL SULFATE PO   Oral   Take 10 mg by mouth daily.         . vitamin B-12 (CYANOCOBALAMIN) 1000  MCG tablet   Oral   Take 1,000 mcg by mouth daily.          There were no vitals taken for this visit. Physical Exam  Constitutional: He is oriented to person, place, and time. He appears well-developed and well-nourished. No distress.  HENT:  Head: Normocephalic and atraumatic.  Right Ear: Hearing normal.  Left Ear: Hearing normal.  Nose: Nose normal.  Mouth/Throat: Oropharynx is clear and moist and mucous membranes are normal.  Eyes: Conjunctivae and EOM are normal. Pupils are equal, round, and reactive to light.  Neck: Normal range of motion. Neck supple.  Cardiovascular: Regular rhythm, S1 normal and S2 normal.  Exam reveals no gallop and no friction rub.   No murmur heard. Pulmonary/Chest: Effort normal  and breath sounds normal. No respiratory distress. He exhibits no tenderness.  Abdominal: Soft. Normal appearance and bowel sounds are normal. There is no hepatosplenomegaly. There is no tenderness. There is no rebound, no guarding, no tenderness at McBurney's point and negative Murphy's sign. No hernia.  Musculoskeletal: Normal range of motion.  Neurological: He is alert and oriented to person, place, and time. He has normal strength. No cranial nerve deficit or sensory deficit. Coordination normal. GCS eye subscore is 4. GCS verbal subscore is 5. GCS motor subscore is 6.  Skin: Skin is warm, dry and intact. No rash noted. No cyanosis.  Psychiatric: He has a normal mood and affect. His speech is normal and behavior is normal. Thought content normal.    ED Course  Procedures (including critical care time) Labs Reviewed  URINALYSIS, ROUTINE W REFLEX MICROSCOPIC   Dg Chest 2 View  08/14/2012   *RADIOLOGY REPORT*  Clinical Data: Preop radiograph.  CHEST - 2 VIEW  Comparison: 06/11/2007  Findings: Normal heart size. Again noted is blunting of the right costophrenic angle which likely reflects chronic pleural/parenchymal .  Scarring is noted in the left base and right midlung.  No superimposed pulmonary edema or airspace consolidation.  Spondylosis is noted throughout the thoracic spine. Peripherally calcified lesion in the spleen is again noted.  IMPRESSION:  1.  No acute cardiopulmonary abnormalities.   Original Report Authenticated By: Signa Kell, M.D.   Diagnosis: Bladder spasm secondary to ureteral stent, possible UTI  MDM  Patient presents to the ER with urinary frequency and bladder spasm type symptoms. Patient was very convinced that he had obstruction of his bladder, however, requested a Foley catheter. Was placed to ensure that he did not have urinary retention and there was no significant retained urine in the bladder. The catheter removed. Patient did have a urinalysis performed which  shows cloudy urine, positive nitrite, moderate leukocytes, too numerous to count white blood cells, too numerous to count red blood cells, and many bacteria on Micro.  Because the patient has a ureteral stent, urology, Doctor Nesi, was consulted to ensure that no further intervention, such as stent removal, was necessary. He did confirm that nothing needed to be done other than initiating her antibiotics. Patient received antibiotics perioperatively and therefore he felt that infection at this time is unlikely. Urinalysis results might be secondary to irritation from the stent, but cannot rule out infection and culture is pending. Doctor Nesi commended starting Septra and to contact the office today. Return to the ER instructions include fever, nausea and vomiting, increasing pain.  Gilda Crease, MD 08/16/12 [phone]

## 2012-08-16 NOTE — ED Notes (Signed)
Pt restless, moaning, crying in pain. Dr. Blinda Leatherwood alerted to bedside, verbal order for foley catheter insertion obtained.

## 2012-08-16 NOTE — ED Notes (Signed)
Pt amb to room 11 with quick steady gait, pt states he had surgery by urology yesterday to remove kidney stones, pt states he has been able to void "only a tiny bit every 15 minutes..." pt states "now I can't pee to save my life.Frank KitchenMarland Hill"

## 2012-08-17 LAB — URINE CULTURE

## 2014-11-25 DIAGNOSIS — I4892 Unspecified atrial flutter: Secondary | ICD-10-CM | POA: Insufficient documentation

## 2014-11-25 DIAGNOSIS — I10 Essential (primary) hypertension: Secondary | ICD-10-CM | POA: Insufficient documentation

## 2014-11-25 DIAGNOSIS — E7800 Pure hypercholesterolemia, unspecified: Secondary | ICD-10-CM | POA: Insufficient documentation

## 2015-04-09 DIAGNOSIS — J309 Allergic rhinitis, unspecified: Secondary | ICD-10-CM | POA: Diagnosis not present

## 2015-04-09 DIAGNOSIS — R05 Cough: Secondary | ICD-10-CM | POA: Diagnosis not present

## 2015-04-09 DIAGNOSIS — R5383 Other fatigue: Secondary | ICD-10-CM | POA: Diagnosis not present

## 2015-04-09 DIAGNOSIS — E119 Type 2 diabetes mellitus without complications: Secondary | ICD-10-CM | POA: Diagnosis not present

## 2015-04-09 DIAGNOSIS — G933 Postviral fatigue syndrome: Secondary | ICD-10-CM | POA: Diagnosis not present

## 2015-06-22 DIAGNOSIS — N132 Hydronephrosis with renal and ureteral calculous obstruction: Secondary | ICD-10-CM | POA: Diagnosis not present

## 2015-06-22 DIAGNOSIS — R1031 Right lower quadrant pain: Secondary | ICD-10-CM | POA: Diagnosis not present

## 2015-06-22 DIAGNOSIS — N202 Calculus of kidney with calculus of ureter: Secondary | ICD-10-CM | POA: Diagnosis not present

## 2015-07-12 DIAGNOSIS — Z125 Encounter for screening for malignant neoplasm of prostate: Secondary | ICD-10-CM | POA: Diagnosis not present

## 2015-07-12 DIAGNOSIS — E119 Type 2 diabetes mellitus without complications: Secondary | ICD-10-CM | POA: Diagnosis not present

## 2015-07-12 DIAGNOSIS — E785 Hyperlipidemia, unspecified: Secondary | ICD-10-CM | POA: Diagnosis not present

## 2015-07-14 DIAGNOSIS — N210 Calculus in bladder: Secondary | ICD-10-CM | POA: Diagnosis not present

## 2015-07-14 DIAGNOSIS — N401 Enlarged prostate with lower urinary tract symptoms: Secondary | ICD-10-CM | POA: Diagnosis not present

## 2015-07-14 DIAGNOSIS — N201 Calculus of ureter: Secondary | ICD-10-CM | POA: Diagnosis not present

## 2015-07-20 DIAGNOSIS — E119 Type 2 diabetes mellitus without complications: Secondary | ICD-10-CM | POA: Diagnosis not present

## 2015-07-20 DIAGNOSIS — R972 Elevated prostate specific antigen [PSA]: Secondary | ICD-10-CM | POA: Diagnosis not present

## 2015-07-20 DIAGNOSIS — N401 Enlarged prostate with lower urinary tract symptoms: Secondary | ICD-10-CM | POA: Diagnosis not present

## 2015-07-20 DIAGNOSIS — R109 Unspecified abdominal pain: Secondary | ICD-10-CM | POA: Diagnosis not present

## 2015-08-19 ENCOUNTER — Other Ambulatory Visit: Payer: Self-pay | Admitting: Urology

## 2015-08-31 ENCOUNTER — Encounter (HOSPITAL_COMMUNITY)
Admission: RE | Admit: 2015-08-31 | Discharge: 2015-08-31 | Disposition: A | Discharge status: Home / Self Care | Payer: PPO | Source: Ambulatory Visit | Attending: Urology | Admitting: Urology

## 2015-08-31 ENCOUNTER — Encounter (HOSPITAL_COMMUNITY): Payer: Self-pay

## 2015-08-31 DIAGNOSIS — Z7982 Long term (current) use of aspirin: Secondary | ICD-10-CM | POA: Diagnosis not present

## 2015-08-31 DIAGNOSIS — Z7951 Long term (current) use of inhaled steroids: Secondary | ICD-10-CM | POA: Diagnosis not present

## 2015-08-31 DIAGNOSIS — R12 Heartburn: Secondary | ICD-10-CM | POA: Diagnosis not present

## 2015-08-31 DIAGNOSIS — Z79899 Other long term (current) drug therapy: Secondary | ICD-10-CM | POA: Diagnosis not present

## 2015-08-31 DIAGNOSIS — N401 Enlarged prostate with lower urinary tract symptoms: Secondary | ICD-10-CM | POA: Diagnosis not present

## 2015-08-31 DIAGNOSIS — Z23 Encounter for immunization: Secondary | ICD-10-CM | POA: Diagnosis not present

## 2015-08-31 DIAGNOSIS — Z87891 Personal history of nicotine dependence: Secondary | ICD-10-CM | POA: Diagnosis not present

## 2015-08-31 DIAGNOSIS — N138 Other obstructive and reflux uropathy: Secondary | ICD-10-CM | POA: Diagnosis not present

## 2015-08-31 DIAGNOSIS — N5201 Erectile dysfunction due to arterial insufficiency: Secondary | ICD-10-CM | POA: Diagnosis not present

## 2015-08-31 DIAGNOSIS — R972 Elevated prostate specific antigen [PSA]: Secondary | ICD-10-CM | POA: Diagnosis not present

## 2015-08-31 DIAGNOSIS — I4891 Unspecified atrial fibrillation: Secondary | ICD-10-CM | POA: Diagnosis not present

## 2015-08-31 DIAGNOSIS — N201 Calculus of ureter: Secondary | ICD-10-CM | POA: Diagnosis not present

## 2015-08-31 DIAGNOSIS — F419 Anxiety disorder, unspecified: Secondary | ICD-10-CM | POA: Diagnosis not present

## 2015-08-31 DIAGNOSIS — E119 Type 2 diabetes mellitus without complications: Secondary | ICD-10-CM | POA: Diagnosis not present

## 2015-08-31 DIAGNOSIS — N210 Calculus in bladder: Secondary | ICD-10-CM | POA: Diagnosis not present

## 2015-08-31 DIAGNOSIS — I10 Essential (primary) hypertension: Secondary | ICD-10-CM | POA: Diagnosis not present

## 2015-08-31 DIAGNOSIS — Z87442 Personal history of urinary calculi: Secondary | ICD-10-CM | POA: Diagnosis not present

## 2015-08-31 DIAGNOSIS — Z7984 Long term (current) use of oral hypoglycemic drugs: Secondary | ICD-10-CM | POA: Diagnosis not present

## 2015-08-31 DIAGNOSIS — E7800 Pure hypercholesterolemia, unspecified: Secondary | ICD-10-CM | POA: Diagnosis not present

## 2015-08-31 HISTORY — DX: Unspecified osteoarthritis, unspecified site: M19.90

## 2015-08-31 HISTORY — DX: Allergy status to unspecified drugs, medicaments and biological substances: Z88.9

## 2015-08-31 HISTORY — DX: Unspecified hearing loss, unspecified ear: H91.90

## 2015-08-31 HISTORY — DX: Chronic rhinitis: J31.0

## 2015-08-31 HISTORY — DX: Reserved for inherently not codable concepts without codable children: IMO0001

## 2015-08-31 HISTORY — DX: Snoring: R06.83

## 2015-08-31 HISTORY — DX: Calculus in bladder: N21.0

## 2015-08-31 LAB — CBC
HEMATOCRIT: 39.2 % (ref 39.0–52.0)
HEMOGLOBIN: 13.1 g/dL (ref 13.0–17.0)
MCH: 30.4 pg (ref 26.0–34.0)
MCHC: 33.4 g/dL (ref 30.0–36.0)
MCV: 91 fL (ref 78.0–100.0)
Platelets: 203 10*3/uL (ref 150–400)
RBC: 4.31 MIL/uL (ref 4.22–5.81)
RDW: 14.1 % (ref 11.5–15.5)
WBC: 6.2 10*3/uL (ref 4.0–10.5)

## 2015-08-31 LAB — BASIC METABOLIC PANEL
ANION GAP: 7 (ref 5–15)
BUN: 13 mg/dL (ref 6–20)
CO2: 28 mmol/L (ref 22–32)
Calcium: 9.8 mg/dL (ref 8.9–10.3)
Chloride: 107 mmol/L (ref 101–111)
Creatinine, Ser: 1.02 mg/dL (ref 0.61–1.24)
Glucose, Bld: 113 mg/dL — ABNORMAL HIGH (ref 65–99)
POTASSIUM: 5.3 mmol/L — AB (ref 3.5–5.1)
SODIUM: 142 mmol/L (ref 135–145)

## 2015-08-31 NOTE — Patient Instructions (Addendum)
Frank Hill  08/31/2015   Your procedure is scheduled on: 09-02-15  Report to [facility] Main  Entrance take [facility]  elevators to 3rd floor to  Short Stay Center at  0830  AM.  Call this number if you have problems the morning of surgery [phone]   Remember: ONLY 1 PERSON MAY GO WITH YOU TO SHORT STAY TO GET  READY MORNING OF YOUR SURGERY.  Do not eat food or drink liquids :After Midnight.     Take these medicines the morning of surgery with A SIP OF WATER:Ranitidine.Sotalol. Use Nasal Spray -usual. DO NOT TAKE ANY DIABETIC MEDICATIONS DAY OF YOUR SURGERY                               You may not have any metal on your body including hair pins and              piercings  Do not wear jewelry, make-up, lotions, powders or perfumes, deodorant             Do not wear nail polish.  Do not shave  48 hours prior to surgery.              Men may shave face and neck.   Do not bring valuables to the hospital. [facility] IS NOT             RESPONSIBLE   FOR VALUABLES.  Contacts, dentures or bridgework may not be worn into surgery.  Leave suitcase in the car. After surgery it may be brought to your room.     Patients discharged the day of surgery will not be allowed to drive home.  Name and phone number of your driver: Sallye Ober -spouse [phone] h  Special Instructions: N/A              Please read over the following fact sheets you were given: _____________________________________________________________________             [facility] - Preparing for Surgery Before surgery, you can play an important role.  Because skin is not sterile, your skin needs to be as free of germs as possible.  You can reduce the number of germs on your skin by washing with CHG (chlorahexidine gluconate) soap before surgery.  CHG is an antiseptic cleaner which kills germs and bonds with the skin to continue killing germs even after washing. Please DO NOT use if you have an allergy  to CHG or antibacterial soaps.  If your skin becomes reddened/irritated stop using the CHG and inform your nurse when you arrive at Short Stay. Do not shave (including legs and underarms) for at least 48 hours prior to the first CHG shower.  You may shave your face/neck. Please follow these instructions carefully:  1.  Shower with CHG Soap the night before surgery and the  morning of Surgery.  2.  If you choose to wash your hair, wash your hair first as usual with your  normal  shampoo.  3.  After you shampoo, rinse your hair and body thoroughly to remove the  shampoo.                           4.  Use CHG as you would any other liquid soap.  You can apply chg  directly  to the skin and wash                       Gently with a scrungie or clean washcloth.  5.  Apply the CHG Soap to your body ONLY FROM THE NECK DOWN.   Do not use on face/ open                           Wound or open sores. Avoid contact with eyes, ears mouth and genitals (private parts).                       Wash face,  Genitals (private parts) with your normal soap.             6.  Wash thoroughly, paying special attention to the area where your surgery  will be performed.  7.  Thoroughly rinse your body with warm water from the neck down.  8.  DO NOT shower/wash with your normal soap after using and rinsing off  the CHG Soap.                9.  Pat yourself dry with a clean towel.            10.  Wear clean pajamas.            11.  Place clean sheets on your bed the night of your first shower and do not  sleep with pets. Day of Surgery : Do not apply any lotions/deodorants the morning of surgery.  Please wear clean clothes to the hospital/surgery center.  FAILURE TO FOLLOW THESE INSTRUCTIONS MAY RESULT IN THE CANCELLATION OF YOUR SURGERY PATIENT SIGNATURE_________________________________  NURSE SIGNATURE__________________________________  ________________________________________________________________________

## 2015-08-31 NOTE — Pre-Procedure Instructions (Signed)
EKG done today.

## 2015-09-01 NOTE — H&P (Signed)
CC:  BPH               HPI:  Frank Hill is a 75 year-old male established patient who is here for follow up regarding further evaluation of BPH and lower urinary tract symptoms.  The patient complains of lower urinary tract symptom(s) that include frequency, urgency, and nocturia. The patient states his most bothersome symptom(s) are the following: urgency. His symptoms have been worse over the last year.   He has frequency and urgency with a good stream . He has had no hematuria or dysuria. He can have some incontinence.                  CC:  I have bladder stones.               HPI:  His problem was diagnosed 06/22/2015.   He has a history of bladder stones with prior removal on 2 occasions but he was noted to have several stones on his recent CT with the largest about 12mm.                   CC:  I have ureteral stone.               HPI:  The problem is on the right side. He first stated noticing pain on approximately 05/31/2015. He is not currently having flank pain, back pain, groin pain, nausea, vomiting, fever or chills. He has not caught a stone in his urine strainer since his symptoms began.   He had a 5 x 9 mm right proximal ureteral stone his recent CT. He has no pain and hasn't had any since 5/23.                   ALLERGIES:          Cipro TABS Penicillins PredniSONE TABS        MEDICATIONS:       Aspirin 81 MG TABS Oral  Co Q 10 CAPS Oral  Fish Oil CAPS Oral  Fluticasone Propionate 50 MCG/ACT Nasal Suspension Nasal  Glucosamine CAPS Oral  Lipoic Acid powder  Lisinopril TABS Oral  MSM TABS Oral  Multi-Vitamin TABS Oral  RaNITidine HCl - 300 MG Oral Capsule Oral  Simvastatin 20 MG Oral Tablet Oral  Sotalol HCl - 120 MG Oral Tablet Oral  Tamsulosin HCl - 0.4 MG Oral Capsule 0 Oral Daily  Vanadyl Sulfate  Vitamin B-12 TABS Oral  Vitamin C TABS Oral            GU PSH: Cysto Bladder Stone <2.5cm - 2014 Cysto Bladder Stone >2.5cm - 2009 Cysto Uretero  Lithotripsy - 2014 Cystoscopy Insert Stent - 2014 Renal ESWL - 2009                   PSH Notes: Cystoscopy With Fragmentation Of Bladder Calculus, Cystoscopy With Ureteroscopy With Lithotripsy, Cystoscopy With Insertion Of Ureteral Stent Left, Cystoscopy With Fragmentation Of Bladder Calculus Over 2.5cm, Tonsillectomy, Lithotripsy                NON-GU PSH:         Remove Tonsils - 2009                           GU PMH:   Bladder Stone, Bladder calculus - 06/22/2015 Calculus Ureter, Calculus of right ureter - 06/22/2015, Proximal Ureteral Stone On The Left, - 2014 Hydronephrosis  with renal and ureteral calculous obstruction, Hydronephrosis with obstructing calculus - 06/22/2015 Kidney Stone, Nephrolithiasis - 06/22/2015 Right lower quadrant pain, Abdominal pain, RLQ (right lower quadrant) - 06/22/2015 BPH w/LUTS, Benign prostatic hyperplasia with urinary obstruction - 11/30/2014 ED, arterial insufficiency, Erectile dysfunction due to arterial insufficiency - 11/30/2014 Elevated PSA, Elevated prostate specific antigen (PSA) - 11/30/2014 Urgency of urination, Urinary urgency - 11/30/2014 Dysuria, Dysuria - 2014 Personal Hx Oth male genital organs diseases, History of prostatitis - 2014 Urinary Retention, Unspec, Acute Urinary Retention - 2014               NON-GU PMH:    Encounter for general adult medical examination without abnormal findings, Encounter for preventive health examination - 06/22/2015 Anxiety disorder, unspecified, Anxiety (Symptom) - 2014 Personal history of other diseases of the circulatory system, History of hypertension - 2014 Personal history of other endocrine, nutritional and metabolic disease, History of hypercholesterolemia - 2014, History of diabetes mellitus, - 2014 , Bladder Calculus - 2014, Heartburn With Regurgitation, - 2009, Arthritis, - 2009              FAMILY HISTORY:        Death In The Family Father - Runs In Family Death In The Family Mother - Runs In  Family Diabetes - Mother Family Health Status - Father's Age - Runs In Family Family Health Status - Mother's Age - Runs In [facility] Family Health Status Number Of Children - Runs In Family Heart Disease - Father, Brother Stroke Syndrome - Grandfather         SOCIAL HISTORY:     Marital Status: Married                  Notes: Never smoker, Chewing Nicotine-containing Substances, Marital History - Currently Married, Alcohol Use, Occupation:, Caffeine Use, Previous History Of Smoking, Tobacco Use            REVIEW OF SYSTEMS:                 GU Review Male:       Patient reports frequent urination, hard to postpone urination, get up at night to urinate, leakage of urine, and erection problems. Patient denies burning/ pain with urination, stream starts and stops, trouble starting your stream, have to strain to urinate , and penile pain.           Gastrointestinal (Upper):       Patient denies nausea, vomiting, and indigestion/ heartburn.           Gastrointestinal (Lower):       Patient denies diarrhea and constipation.           Constitutional:       Patient denies fever, night sweats, weight loss, and fatigue.           Skin:       Patient denies skin rash/ lesion and itching.           Eyes:       Patient denies blurred vision and double vision.           Ears/ Nose/ Throat:       Hard of hearing. Patient denies sore throat and sinus problems.           Hematologic/Lymphatic:       Patient denies swollen glands and easy bruising.           Cardiovascular:       Patient denies leg swelling and chest pains.  Respiratory:       Patient denies cough and shortness of breath.           Endocrine:       Patient denies excessive thirst.           Musculoskeletal:       Patient reports joint pain. Patient denies back pain.           Neurological:       Patient denies headaches and dizziness.           Psychologic:       Patient denies depression and anxiety.           VITAL SIGNS:                  Weight: 200 lb/90.7 kg       BP: 166/75 mmHg       Pulse: 63 /min       Temp: 98.9 F / 37 C        MULTI-SYSTEM PHYSICAL EXAMINATION:    Constitutional: Obese. No physical deformities. Normally developed. Good grooming.   Respiratory: No labored breathing, no use of accessory muscles.   Cardiovascular: Normal temperature, normal extremity pulses, no swelling, no varicosities.   Gastrointestinal: Obese abdomen. No mass, no tenderness, no rigidity.    PAST DATA REVIEWED:   Source Of History:  Patient  X-Ray Review: C.T. Abdomen: Reviewed Films. Reviewed Report. Tiny bilateral renal stones and a right proximal stone that is 5x61mm C.T. Pelvis: Reviewed Films. Reviewed Report. Mx bladder stones up to 12mm and an enlarged prostate.     PROCEDURES:    KUB - 74000   A single view of the abdomen is obtained. The right ureteral stone is not clearly seen but could be at the iliac level or the UVJ. he has multiple bladder stones up to 10mm in size. Gas and soft tissue shadows are normal.    Bony Abnormalities:  He has severe lumbar degenerative disease.             Urinalysis - 81003  Dipstick Dipstick Cont'd  Specimen: Voided Bilirubin: Neg  Color: Yellow Ketones: Neg  Appearance: Clear Blood: Neg  Specific Gravity: 1.020 Protein: Neg  pH: 5.5 Urobilinogen: 0.2  Glucose: Neg Nitrites: Neg   Leukocyte Esterase: Neg    ASSESSMENT:     ICD-10 Details  1 GU:  BPH w/LUTS - N40.1 Worsening - He has a large obstructing prosate with primarily irritative symptoms.   2  Bladder Stone - N21.0 Worsening - He has multiple bladder stones up to 12mm.   3  Calculus Ureter - N20.1 Improving - the ureteral stone has moved to either the iliac or UVJ level on KUB today but the location is not clear.    PLAN:   Schedule  Return Visit: ASAP - Schedule Surgery  Return Notes: I am going to post him for cystoscopy, right RTG, possible right ureteroscopy with stent, cystolithalopaxy and TURP.

## 2015-09-02 ENCOUNTER — Ambulatory Visit (HOSPITAL_COMMUNITY): Payer: PPO | Admitting: Anesthesiology

## 2015-09-02 ENCOUNTER — Observation Stay (HOSPITAL_COMMUNITY)
Admission: RE | Admit: 2015-09-02 | Discharge: 2015-09-03 | Disposition: A | Discharge status: Home / Self Care | Payer: PPO | Source: Ambulatory Visit | Attending: Urology | Admitting: Urology

## 2015-09-02 ENCOUNTER — Encounter (HOSPITAL_COMMUNITY): Payer: Self-pay | Admitting: *Deleted

## 2015-09-02 ENCOUNTER — Encounter (HOSPITAL_COMMUNITY)
Admission: RE | Disposition: A | Discharge status: Home / Self Care | Payer: Self-pay | Source: Ambulatory Visit | Attending: Urology

## 2015-09-02 DIAGNOSIS — R12 Heartburn: Secondary | ICD-10-CM | POA: Insufficient documentation

## 2015-09-02 DIAGNOSIS — Z7982 Long term (current) use of aspirin: Secondary | ICD-10-CM | POA: Insufficient documentation

## 2015-09-02 DIAGNOSIS — N410 Acute prostatitis: Secondary | ICD-10-CM | POA: Diagnosis not present

## 2015-09-02 DIAGNOSIS — E119 Type 2 diabetes mellitus without complications: Secondary | ICD-10-CM | POA: Insufficient documentation

## 2015-09-02 DIAGNOSIS — N138 Other obstructive and reflux uropathy: Secondary | ICD-10-CM | POA: Diagnosis not present

## 2015-09-02 DIAGNOSIS — Z87442 Personal history of urinary calculi: Secondary | ICD-10-CM | POA: Insufficient documentation

## 2015-09-02 DIAGNOSIS — F419 Anxiety disorder, unspecified: Secondary | ICD-10-CM | POA: Insufficient documentation

## 2015-09-02 DIAGNOSIS — N201 Calculus of ureter: Secondary | ICD-10-CM | POA: Diagnosis present

## 2015-09-02 DIAGNOSIS — E7800 Pure hypercholesterolemia, unspecified: Secondary | ICD-10-CM | POA: Diagnosis not present

## 2015-09-02 DIAGNOSIS — Z23 Encounter for immunization: Secondary | ICD-10-CM | POA: Insufficient documentation

## 2015-09-02 DIAGNOSIS — N320 Bladder-neck obstruction: Secondary | ICD-10-CM | POA: Diagnosis not present

## 2015-09-02 DIAGNOSIS — N5201 Erectile dysfunction due to arterial insufficiency: Secondary | ICD-10-CM | POA: Diagnosis not present

## 2015-09-02 DIAGNOSIS — I4891 Unspecified atrial fibrillation: Secondary | ICD-10-CM | POA: Diagnosis not present

## 2015-09-02 DIAGNOSIS — Z79899 Other long term (current) drug therapy: Secondary | ICD-10-CM | POA: Insufficient documentation

## 2015-09-02 DIAGNOSIS — N210 Calculus in bladder: Principal | ICD-10-CM | POA: Diagnosis present

## 2015-09-02 DIAGNOSIS — N400 Enlarged prostate without lower urinary tract symptoms: Secondary | ICD-10-CM | POA: Diagnosis not present

## 2015-09-02 DIAGNOSIS — Z7984 Long term (current) use of oral hypoglycemic drugs: Secondary | ICD-10-CM | POA: Insufficient documentation

## 2015-09-02 DIAGNOSIS — I10 Essential (primary) hypertension: Secondary | ICD-10-CM | POA: Insufficient documentation

## 2015-09-02 DIAGNOSIS — R972 Elevated prostate specific antigen [PSA]: Secondary | ICD-10-CM | POA: Diagnosis not present

## 2015-09-02 DIAGNOSIS — N401 Enlarged prostate with lower urinary tract symptoms: Secondary | ICD-10-CM | POA: Insufficient documentation

## 2015-09-02 DIAGNOSIS — Z7951 Long term (current) use of inhaled steroids: Secondary | ICD-10-CM | POA: Insufficient documentation

## 2015-09-02 DIAGNOSIS — Z87891 Personal history of nicotine dependence: Secondary | ICD-10-CM | POA: Insufficient documentation

## 2015-09-02 HISTORY — PX: CYSTOSCOPY WITH RETROGRADE PYELOGRAM, URETEROSCOPY AND STENT PLACEMENT: SHX5789

## 2015-09-02 HISTORY — PX: TRANSURETHRAL RESECTION OF PROSTATE: SHX73

## 2015-09-02 LAB — GLUCOSE, CAPILLARY
GLUCOSE-CAPILLARY: 167 mg/dL — AB (ref 65–99)
GLUCOSE-CAPILLARY: 109 mg/dL — AB (ref 65–99)
Glucose-Capillary: 134 mg/dL — ABNORMAL HIGH (ref 65–99)
Glucose-Capillary: 124 mg/dL — ABNORMAL HIGH (ref 65–99)

## 2015-09-02 LAB — HEMOGLOBIN A1C
Hgb A1c MFr Bld: 6.9 % — ABNORMAL HIGH (ref 4.8–5.6)
Mean Plasma Glucose: 151 mg/dL

## 2015-09-02 SURGERY — CYSTOURETEROSCOPY, WITH RETROGRADE PYELOGRAM AND STENT INSERTION
Anesthesia: General | Laterality: Right

## 2015-09-02 MED ORDER — LISINOPRIL 20 MG PO TABS
20.0000 mg | ORAL_TABLET | Freq: Two times a day (BID) | ORAL | Status: DC
  Administered 2015-09-02 – 2015-09-03 (×2): 20 mg via ORAL
  Filled 2015-09-02 (×2): qty 1

## 2015-09-02 MED ORDER — LIDOCAINE HCL 2 % EX GEL
CUTANEOUS | Status: AC
Start: 2015-09-02 — End: 2015-09-02
  Filled 2015-09-02: qty 5

## 2015-09-02 MED ORDER — PROPOFOL 10 MG/ML IV BOLUS
INTRAVENOUS | Status: DC | PRN
  Administered 2015-09-02: 200 mg via INTRAVENOUS

## 2015-09-02 MED ORDER — DIPHENHYDRAMINE HCL 12.5 MG/5ML PO ELIX: 12.5000 mg | ORAL_SOLUTION | Freq: Four times a day (QID) | ORAL | Status: DC | PRN

## 2015-09-02 MED ORDER — LACTATED RINGERS IV SOLN
INTRAVENOUS | Status: DC | PRN
  Administered 2015-09-02 (×2): via INTRAVENOUS

## 2015-09-02 MED ORDER — SUGAMMADEX SODIUM 200 MG/2ML IV SOLN
INTRAVENOUS | Status: DC | PRN
  Administered 2015-09-02: 200 mg via INTRAVENOUS

## 2015-09-02 MED ORDER — BELLADONNA ALKALOIDS-OPIUM 16.2-60 MG RE SUPP
RECTAL | Status: AC
  Filled 2015-09-02: qty 1

## 2015-09-02 MED ORDER — GENTAMICIN SULFATE 40 MG/ML IJ SOLN
5.0000 mg/kg | Freq: Once | INTRAVENOUS | Status: AC
  Administered 2015-09-02: 460 mg via INTRAVENOUS
  Filled 2015-09-02: qty 11.5

## 2015-09-02 MED ORDER — SENNOSIDES-DOCUSATE SODIUM 8.6-50 MG PO TABS: 1.0000 | ORAL_TABLET | Freq: Every evening | ORAL | Status: DC | PRN

## 2015-09-02 MED ORDER — FENTANYL CITRATE (PF) 100 MCG/2ML IJ SOLN
25.0000 ug | INTRAMUSCULAR | Status: DC | PRN
  Administered 2015-09-02: 50 ug via INTRAVENOUS

## 2015-09-02 MED ORDER — CEFAZOLIN SODIUM-DEXTROSE 2-4 GM/100ML-% IV SOLN
2.0000 g | INTRAVENOUS | Status: AC
  Administered 2015-09-02: 2 g via INTRAVENOUS
  Filled 2015-09-02: qty 100

## 2015-09-02 MED ORDER — SODIUM CHLORIDE 0.9 % IR SOLN
3000.0000 mL | Status: DC
  Administered 2015-09-02: 3000 mL

## 2015-09-02 MED ORDER — PROMETHAZINE HCL 25 MG/ML IJ SOLN: 6.2500 mg | INTRAMUSCULAR | Status: DC | PRN

## 2015-09-02 MED ORDER — DIPHENHYDRAMINE HCL 50 MG/ML IJ SOLN: 12.5000 mg | Freq: Four times a day (QID) | INTRAMUSCULAR | Status: DC | PRN

## 2015-09-02 MED ORDER — FAMOTIDINE 20 MG PO TABS
20.0000 mg | ORAL_TABLET | Freq: Two times a day (BID) | ORAL | Status: DC
  Administered 2015-09-02 – 2015-09-03 (×2): 20 mg via ORAL
  Filled 2015-09-02 (×2): qty 1

## 2015-09-02 MED ORDER — HYOSCYAMINE SULFATE 0.125 MG SL SUBL
0.1250 mg | SUBLINGUAL_TABLET | SUBLINGUAL | Status: DC | PRN
  Filled 2015-09-02: qty 1

## 2015-09-02 MED ORDER — ONDANSETRON HCL 4 MG/2ML IJ SOLN
INTRAMUSCULAR | Status: AC
  Filled 2015-09-02: qty 2

## 2015-09-02 MED ORDER — ACETAMINOPHEN 325 MG PO TABS: 650.0000 mg | ORAL_TABLET | ORAL | Status: DC | PRN

## 2015-09-02 MED ORDER — EPHEDRINE SULFATE 50 MG/ML IJ SOLN
INTRAMUSCULAR | Status: DC | PRN
  Administered 2015-09-02: 5 mg via INTRAVENOUS

## 2015-09-02 MED ORDER — FLUTICASONE PROPIONATE 50 MCG/ACT NA SUSP
2.0000 | Freq: Two times a day (BID) | NASAL | Status: DC
  Administered 2015-09-02: 2 via NASAL
  Filled 2015-09-02: qty 16

## 2015-09-02 MED ORDER — HYDROMORPHONE HCL 1 MG/ML IJ SOLN: 0.5000 mg | INTRAMUSCULAR | Status: DC | PRN

## 2015-09-02 MED ORDER — SUGAMMADEX SODIUM 200 MG/2ML IV SOLN
INTRAVENOUS | Status: AC
Start: 2015-09-02 — End: 2015-09-02
  Filled 2015-09-02: qty 2

## 2015-09-02 MED ORDER — INSULIN ASPART 100 UNIT/ML ~~LOC~~ SOLN
0.0000 [IU] | Freq: Three times a day (TID) | SUBCUTANEOUS | Status: DC
  Administered 2015-09-02: 3 [IU] via SUBCUTANEOUS
  Administered 2015-09-03: 2 [IU] via SUBCUTANEOUS

## 2015-09-02 MED ORDER — FENTANYL CITRATE (PF) 100 MCG/2ML IJ SOLN
INTRAMUSCULAR | Status: DC | PRN
  Administered 2015-09-02 (×2): 50 ug via INTRAVENOUS
  Administered 2015-09-02: 100 ug via INTRAVENOUS

## 2015-09-02 MED ORDER — LIDOCAINE HCL (CARDIAC) 20 MG/ML IV SOLN
INTRAVENOUS | Status: DC | PRN
  Administered 2015-09-02: 100 mg via INTRAVENOUS

## 2015-09-02 MED ORDER — METFORMIN HCL 500 MG PO TABS
500.0000 mg | ORAL_TABLET | Freq: Two times a day (BID) | ORAL | Status: DC
  Administered 2015-09-02 – 2015-09-03 (×2): 500 mg via ORAL
  Filled 2015-09-02 (×2): qty 1

## 2015-09-02 MED ORDER — ONDANSETRON HCL 4 MG/2ML IJ SOLN: 4.0000 mg | INTRAMUSCULAR | Status: DC | PRN

## 2015-09-02 MED ORDER — ONDANSETRON HCL 4 MG/2ML IJ SOLN
INTRAMUSCULAR | Status: DC | PRN
  Administered 2015-09-02: 4 mg via INTRAVENOUS

## 2015-09-02 MED ORDER — SOTALOL HCL 120 MG PO TABS
120.0000 mg | ORAL_TABLET | Freq: Two times a day (BID) | ORAL | Status: DC
  Administered 2015-09-02 – 2015-09-03 (×2): 120 mg via ORAL
  Filled 2015-09-02 (×2): qty 1

## 2015-09-02 MED ORDER — BELLADONNA ALKALOIDS-OPIUM 16.2-60 MG RE SUPP
RECTAL | Status: DC | PRN
  Administered 2015-09-02: 1 via RECTAL

## 2015-09-02 MED ORDER — SODIUM CHLORIDE 0.9 % IV SOLN
INTRAVENOUS | Status: DC | PRN
  Administered 2015-09-02: 3 mL

## 2015-09-02 MED ORDER — FLEET ENEMA 7-19 GM/118ML RE ENEM: 1.0000 | ENEMA | Freq: Once | RECTAL | Status: DC | PRN

## 2015-09-02 MED ORDER — FENTANYL CITRATE (PF) 100 MCG/2ML IJ SOLN
INTRAMUSCULAR | Status: AC
  Filled 2015-09-02: qty 2

## 2015-09-02 MED ORDER — LIDOCAINE HCL (CARDIAC) 20 MG/ML IV SOLN
INTRAVENOUS | Status: AC
  Filled 2015-09-02: qty 5

## 2015-09-02 MED ORDER — SIMVASTATIN 20 MG PO TABS
20.0000 mg | ORAL_TABLET | Freq: Every day | ORAL | Status: DC
  Administered 2015-09-02: 20 mg via ORAL
  Filled 2015-09-02: qty 1

## 2015-09-02 MED ORDER — PROPOFOL 10 MG/ML IV BOLUS
INTRAVENOUS | Status: AC
  Filled 2015-09-02: qty 20

## 2015-09-02 MED ORDER — ROCURONIUM BROMIDE 100 MG/10ML IV SOLN
INTRAVENOUS | Status: DC | PRN
  Administered 2015-09-02: 50 mg via INTRAVENOUS

## 2015-09-02 MED ORDER — DEXTROSE-NACL 5-0.45 % IV SOLN
INTRAVENOUS | Status: DC
Start: 2015-09-02 — End: 2015-09-03
  Administered 2015-09-02 (×2): via INTRAVENOUS

## 2015-09-02 MED ORDER — POLYVINYL ALCOHOL 1.4 % OP SOLN
2.0000 [drp] | Freq: Three times a day (TID) | OPHTHALMIC | Status: DC | PRN
  Filled 2015-09-02: qty 15

## 2015-09-02 MED ORDER — CEFAZOLIN SODIUM-DEXTROSE 2-4 GM/100ML-% IV SOLN
INTRAVENOUS | Status: AC
  Filled 2015-09-02: qty 100

## 2015-09-02 MED ORDER — HYDROCODONE-ACETAMINOPHEN 5-325 MG PO TABS: 1.0000 | ORAL_TABLET | ORAL | Status: DC | PRN

## 2015-09-02 MED ORDER — LACTATED RINGERS IV SOLN: INTRAVENOUS | Status: DC

## 2015-09-02 MED ORDER — BISACODYL 10 MG RE SUPP: 10.0000 mg | Freq: Every day | RECTAL | Status: DC | PRN

## 2015-09-02 MED ORDER — DOCUSATE SODIUM 100 MG PO CAPS
100.0000 mg | ORAL_CAPSULE | Freq: Two times a day (BID) | ORAL | Status: DC
  Administered 2015-09-02 – 2015-09-03 (×2): 100 mg via ORAL
  Filled 2015-09-02 (×2): qty 1

## 2015-09-02 MED ORDER — ZOLPIDEM TARTRATE 5 MG PO TABS: 5.0000 mg | ORAL_TABLET | Freq: Every evening | ORAL | Status: DC | PRN

## 2015-09-02 SURGICAL SUPPLY — 35 items
BAG URINE DRAINAGE (UROLOGICAL SUPPLIES) IMPLANT
BAG URO CATCHER STRL LF (MISCELLANEOUS) ×4 IMPLANT
BASKET LASER NITINOL 1.9FR (BASKET) IMPLANT
BASKET STONE NCOMPASS (UROLOGICAL SUPPLIES) IMPLANT
BSKT STON RTRVL 120 1.9FR (BASKET)
CATH FOLEY 3WAY 30CC 22FR (CATHETERS) ×2 IMPLANT
CATH URET 5FR 28IN OPEN ENDED (CATHETERS) ×2 IMPLANT
CATH URET DUAL LUMEN 6-10FR 50 (CATHETERS) ×2 IMPLANT
CLOTH BEACON ORANGE TIMEOUT ST (SAFETY) ×4 IMPLANT
ELECT REM PT RETURN 9FT ADLT (ELECTROSURGICAL)
ELECTRODE REM PT RTRN 9FT ADLT (ELECTROSURGICAL) ×2 IMPLANT
FIBER LASER FLEXIVA 1000 (UROLOGICAL SUPPLIES) ×2 IMPLANT
FIBER LASER FLEXIVA 365 (UROLOGICAL SUPPLIES) IMPLANT
FIBER LASER FLEXIVA 550 (UROLOGICAL SUPPLIES) IMPLANT
FIBER LASER TRAC TIP (UROLOGICAL SUPPLIES) IMPLANT
GLOVE SURG SS PI 8.0 STRL IVOR (GLOVE) ×6 IMPLANT
GOWN STRL REUS W/TWL XL LVL3 (GOWN DISPOSABLE) ×4 IMPLANT
GUIDEWIRE STR DUAL SENSOR (WIRE) ×4 IMPLANT
HOLDER FOLEY CATH W/STRAP (MISCELLANEOUS) IMPLANT
IV NS 1000ML (IV SOLUTION)
IV NS 1000ML BAXH (IV SOLUTION) ×2 IMPLANT
IV NS IRRIG 3000ML ARTHROMATIC (IV SOLUTION) ×2 IMPLANT
LOOP CUT BIPOLAR 24F LRG (ELECTROSURGICAL) ×2 IMPLANT
MANIFOLD NEPTUNE II (INSTRUMENTS) ×4 IMPLANT
NDL BIOPSY 18X20 MAGNUM (NEEDLE) IMPLANT
NEEDLE BIOPSY 18X20 MAGNUM (NEEDLE) ×4 IMPLANT
NS IRRIG 1000ML POUR BTL (IV SOLUTION) ×2 IMPLANT
PACK CYSTO (CUSTOM PROCEDURE TRAY) ×4 IMPLANT
SET ASPIRATION TUBING (TUBING) ×2 IMPLANT
SHEATH ACCESS URETERAL 38CM (SHEATH) ×4 IMPLANT
SHEATH URET ACCESS 10/12FR (MISCELLANEOUS) IMPLANT
SYR 30ML LL (SYRINGE) ×2 IMPLANT
SYRINGE IRR TOOMEY STRL 70CC (SYRINGE) ×2 IMPLANT
TUBING CONNECTING 10 (TUBING) ×3 IMPLANT
TUBING CONNECTING 10' (TUBING) ×1

## 2015-09-02 NOTE — Anesthesia Preprocedure Evaluation (Signed)
Anesthesia Evaluation  Patient identified by MRN, date of birth, ID band Patient awake    Reviewed: Allergy & Precautions, NPO status , Patient's Chart, lab work & pertinent test results, reviewed documented beta blocker date and time   Airway Mallampati: III  TM Distance: >3 FB Neck ROM: Full    Dental  (+) Dental Advisory Given   Pulmonary former smoker,    breath sounds clear to auscultation       Cardiovascular hypertension, Pt. on medications and Pt. on home beta blockers + dysrhythmias (hx of in setting of pericarditis. NSR since) Atrial Fibrillation  Rhythm:Regular Rate:Normal     Neuro/Psych negative neurological ROS     GI/Hepatic negative GI ROS, Neg liver ROS,   Endo/Other  diabetes, Type 2  Renal/GU      Musculoskeletal  (+) Arthritis ,   Abdominal   Peds  Hematology negative hematology ROS (+)   Anesthesia Other Findings   Reproductive/Obstetrics                             Anesthesia Physical Anesthesia Plan  ASA: II  Anesthesia Plan: General   Post-op Pain Management:    Induction: Intravenous  Airway Management Planned: Oral ETT  Additional Equipment:   Intra-op Plan:   Post-operative Plan: Extubation in OR  Informed Consent: I have reviewed the patients History and Physical, chart, labs and discussed the procedure including the risks, benefits and alternatives for the proposed anesthesia with the patient or authorized representative who has indicated his/her understanding and acceptance.   Dental advisory given  Plan Discussed with: CRNA  Anesthesia Plan Comments:         Anesthesia Quick Evaluation

## 2015-09-02 NOTE — Transfer of Care (Signed)
Immediate Anesthesia Transfer of Care Note  Patient: Frank Hill  Procedure(s) Performed: Procedure(s): CYSTOSCOPY WITH RIGHT RETROGRADE/URETEROSCOPY, CYSTOLITHOPAXY (Right) TRANSURETHRAL RESECTION OF THE PROSTATE (TURP) AND PROSTATE BIOPSY (N/A)  Patient Location: PACU  Anesthesia Type:General  Level of Consciousness:  sedated, patient cooperative and responds to stimulation  Airway & Oxygen Therapy:Patient Spontanous Breathing and Patient connected to face mask oxgen  Post-op Assessment:  Report given to PACU RN and Post -op Vital signs reviewed and stable  Post vital signs:  Reviewed and stable  Last Vitals:  Vitals:   09/02/15 0843  BP: (!) 172/78  Pulse: (!) 56  Resp: 18  Temp: 36.4 C    Complications: No apparent anesthesia complications

## 2015-09-02 NOTE — Progress Notes (Signed)
Patient ID: Frank Hill, male   DOB: October 26, 1940, 75 y.o.   MRN: [id number] He is doing well post op with clear urine.  BP (!) 135/53 (BP Location: Right Arm)   Pulse 68   Temp 97.5 F (36.4 C) (Oral)   Resp 16   Ht 5\' 10"  (1.778 m)   Wt 93.6 kg (206 lb 4.8 oz)   SpO2 100%   BMI 29.60 kg/m    I will d/c the foley in the morning and send home when voiding.

## 2015-09-02 NOTE — Op Note (Signed)
NAMEJAIVEER, Frank NO.:  [id number]  MEDICAL RECORD NO.:  [id number]  LOCATION:  [facility]                         FACILITY:  [facility]  PHYSICIAN:  Excell Seltzer. Annabell Howells, M.D.    DATE OF BIRTH:  09-08-1940  DATE OF PROCEDURE:  09/02/2015 DATE OF DISCHARGE:                              OPERATIVE REPORT   PROCEDURES: 1. Digitally-guided transrectal prostate biopsy. 2. Cystoscopy with right retrograde pyelogram with interpretation. 3. Right ureteroscopy. 4. Cystolitholapaxy less than 2.5-cm stone. 5. Transurethral resection of the prostate.  PREOPERATIVE DIAGNOSES: 1. Possible right ureteral stone. 2. Bladder stones. 3. Elevated PSA. 4. Benign prostatic hypertrophy and bladder outlet obstruction.  POSTOPERATIVE DIAGNOSES: 1. Possible right ureteral stone. 2. Bladder stones. 3. Elevated PSA. 4. Benign prostatic hypertrophy and bladder outlet obstruction.  SURGEON:  Excell Seltzer. Annabell Howells, M.D.  ANESTHESIA:  General.  SPECIMEN:  Six prostate cores transurethral prostate chips and bladder stones.  BLOOD LOSS:  Approximately 100 mL.  DRAINS:  22-French three-way Foley catheter.  COMPLICATIONS:  None.  INDICATIONS:  Frank Frank is a 75 year old white male with multiple urologic problems including elevated PSA, BPH with bladder outlet obstruction, possible right distal ureteral stone and bladder stones, and he presents for the above-mentioned procedure.  FINDINGS AND PROCEDURE:  He was given Ancef and gentamicin for antibiotic coverage.  A general anesthetic was induced, he was placed in the lithotomy position and fitted with PAS hose.  Initially, a transrectal prostate biopsy was performed.  We were unable to obtain ultrasound, so a 6-core digitally-guided biopsy was obtained with cores from the right base, right mid, right apex, left base, left mid and left apex of the prostate.  Once the biopsies were obtained, the patient was prepped with Betadine solution and  draped in usual sterile fashion.  Cystoscopy was performed using the 23-French scope and the 30-degree lens.  Examination revealed a normal urethra.  The external sphincter was intact.  The prostatic urethra was approximately 4 cm in length with bilobar hyperplasia with coaptation and obstruction and moderately- elevated bladder neck.  Inspection of the bladder revealed mild-to-moderate trabeculation. There were multiple bladder stones, the largest approximately 12-15 mm of the jackstone characteristics.  Ureteral orifices were unremarkable.  Several of the smaller stones were removed just through the cystoscope without fragmentation.  Moderate-sized stone was removed using the grasping forceps.  At this point, a right retrograde pyelogram was performed with a 5- [place] open-ended catheter and Cystografin.  The retrograde pyelogram revealed some J-hooking of the distal ureter with filling defect in the distal ureter that was suspicious of the stone.  The proximal ureter was unremarkable, contrast filled the kidney without evidence of hydronephrosis.  A guidewire was passed through the opening into the kidney and the open- ended catheter was removed along with the cystoscope, the 12-French inner-core of 38-cm digital access sheath was passed over the wire and the distal ureter was dilated.  A dual-lumen 6.5-French semi-rigid ureteroscope was then passed. Inspection of the ureter to the iliacs revealed no obvious stones; however, I noted that after dilating the ureter, the patient had a bladder contraction and voided and two additional small stones past, that were not noted on previous  inspection of the bladder and it was felt these were likely the ureteral stones that were allowed to exit after dilation.  Just to be on the safe side, I inserted the assembled access sheath, removed the wire and core and inserted the single-lumen digital flexible ureteroscope into the upper  proximal ureter beyond where the ureteral stone had been noted on initial CT in May.  No additional stones were identified.  The scope was removed under direct vision.  There were some mild mucosal irritation of the distal ureter, but not sufficient to require a stent.  The access sheath and scope were removed.  I then reinserted the cystoscope and used a 1000-micron holmium laser fiber, set on 2 watts and 5 hertz to fragment the [place] stone.  This was broken into manageable fragments, which were then evacuated through the cystoscope sheath.  Once the stone had been entirely removed, the cystoscope sheath was removed and a 28-French continuous flow resectoscope sheath was inserted with the aid of a visual obturator.  Once in place, this was fitted with an Wandra Scot handle with a 30-degree lens and bipolar loop.  Saline was used as an irrigant.  Transurethral resection of the prostate was then performed.  The bladder neck fibers were exposed from 5 to 7 o'clock and the floor of the prostate was resected out to alongside of the verumontanum.  The left lobe of the prostate was resected from bladder neck to apex followed by the right lobe.  Chips were evacuated and I then resected additional apical and anterior tissue and lateral tissue as needed.  The chips were evacuated once again and final hemostasis was achieved by generous fulguration.  Final inspection revealed intact ureteral orifices and an intact external sphincter with a widely patent prostatic fossa with no active bleeding.  The scope was removed.  Pressure on the bladder produced an excellent flow.  A 22-French 3-way Foley catheter was then inserted with the aid of a catheter guide.  The balloon was filled with 30 mL of sterile fluid.  The bladder was drained.  The catheter was irrigated with clear return, was placed to continuous irrigation and straight drainage.  The patient was taken down from lithotomy position, his  anesthetic was reversed.  He was moved to the recovery room in stable condition.  There were no complications.     Excell Seltzer. Annabell Howells, M.D.     JJW/MEDQ  D:  09/02/2015  T:  09/02/2015  Job:  [id number]

## 2015-09-02 NOTE — Anesthesia Postprocedure Evaluation (Signed)
Anesthesia Post Note  Patient: [name]  Procedure(s) Performed: Procedure(s) (LRB): CYSTOSCOPY WITH RIGHT RETROGRADE/URETEROSCOPY, CYSTOLITHOPAXY (Right) TRANSURETHRAL RESECTION OF THE PROSTATE (TURP) AND PROSTATE BIOPSY (N/A)  Patient location during evaluation: PACU Anesthesia Type: General Level of consciousness: awake and alert, oriented and patient cooperative Pain management: pain level controlled Vital Signs Assessment: post-procedure vital signs reviewed and stable Respiratory status: spontaneous breathing, nonlabored ventilation and respiratory function stable Cardiovascular status: blood pressure returned to baseline and stable Postop Assessment: no signs of nausea or vomiting Anesthetic complications: no    Last Vitals:  Vitals:   09/02/15 1330 09/02/15 1448  BP: (!) 173/61 (!) 135/53  Pulse: 63 68  Resp: 16 16  Temp: 36.4 C 36.4 C    Last Pain:  Vitals:   09/02/15 1503  TempSrc:   PainSc: 2                  Kassia Demarinis,E. Keshauna Degraffenreid

## 2015-09-02 NOTE — Discharge Instructions (Signed)
Transurethral Resection of the Prostate, Care After °Refer to this sheet in the next few weeks. These instructions provide you with information on caring for yourself after your procedure. Your caregiver also may give you specific instructions. Your treatment has been planned according to current medical practices, but complications sometimes occur. Call your caregiver if you have any problems or questions after your procedure. °HOME CARE INSTRUCTIONS  °Recovery can take 4-6 weeks. Avoid alcohol, caffeinated drinks, and spicy foods for 2 weeks after your procedure. Drink enough fluids to keep your urine clear or pale yellow. Urinate as soon as you feel the urge to do so. Do not try to hold your urine for long periods of time. °During recovery you may experience pain caused by bladder spasms, which result in a very intense urge to urinate. Take all medicines as directed by your caregiver, including medicines for pain. Try to limit the amount of pain medicines you take because it can cause constipation. If you do become constipated, do not strain to move your bowels. Straining can increase bleeding. Constipation can be minimized by increasing the amount fluids and fiber in your diet. Your caregiver also may prescribe a stool softener. °Do not lift heavy objects (more than 5 lb [2.25 kg]) or perform exercises that cause you to strain for at least 1 month after your procedure. When sitting, you may want to sit in a soft chair or use a cushion. For the first 10 days after your procedure, avoid the following activities: °· Running. °· Strenuous work. °· Long walks. °· Riding in a car for extended periods. °· Sex. °SEEK MEDICAL CARE IF: °· You have difficulty urinating. °· You have blood in your urine that does not go away after you rest or increase your fluid intake. °· You have swelling in your penis or scrotum. °SEEK IMMEDIATE MEDICAL CARE IF:  °· You are suddenly unable to urinate. °· You notice blood clots in your  urine. °· You have chills. °· You have a fever. °· You have pain in your back or lower abdomen. °· You have pain or swelling in your legs. °MAKE SURE YOU:  °· Understand these instructions. °· Will watch your condition. °· Will get help right away if you are not doing well or get worse. °  °This information is not intended to replace advice given to you by your health care provider. Make sure you discuss any questions you have with your health care provider. °  °Document Released: 01/16/2005 Document Revised: 02/06/2014 Document Reviewed: 02/24/2011 °Elsevier Interactive Patient Education ©2016 [employer] ° °

## 2015-09-02 NOTE — Anesthesia Procedure Notes (Signed)
Procedure Name: Intubation Date/Time: 09/02/2015 10:33 AM Performed by: Paris Lore Pre-anesthesia Checklist: Patient identified, Emergency Drugs available, Suction available and Patient being monitored Patient Re-evaluated:Patient Re-evaluated prior to inductionOxygen Delivery Method: Circle system utilized Preoxygenation: Pre-oxygenation with 100% oxygen Intubation Type: IV induction and Cricoid Pressure applied Ventilation: Mask ventilation without difficulty Laryngoscope Size: Mac and 4 Grade View: Grade I Tube type: Oral Tube size: 7.5 mm Number of attempts: 1 Airway Equipment and Method: Stylet Placement Confirmation: ETT inserted through vocal cords under direct vision,  positive ETCO2 and breath sounds checked- equal and bilateral Secured at: 25 cm Tube secured with: Tape Dental Injury: Teeth and Oropharynx as per pre-operative assessment

## 2015-09-02 NOTE — H&P (Addendum)
CC: BPH  HPI: Frank Hill is a 75 year-old male established patient who is here for follow up regarding further evaluation of BPH and lower urinary tract symptoms.  The patient complains of lower urinary tract symptom(s) that include frequency, urgency, and nocturia. The patient states his most bothersome symptom(s) are the following: urgency. His symptoms have been worse over the last year.   He has frequency and urgency with a good stream . He has had no hematuria or dysuria. He can have some incontinence.     CC: I have bladder stones.  HPI: His problem was diagnosed 06/22/2015.   He has a history of bladder stones with prior removal on 2 occasions but he was noted to have several stones on his recent CT with the largest about 12mm.      CC: I have ureteral stone.  HPI: The problem is on the right side. He first stated noticing pain on approximately 05/31/2015. He is not currently having flank pain, back pain, groin pain, nausea, vomiting, fever or chills. He has not caught a stone in his urine strainer since his symptoms began.   He had a 5 x 9 mm right proximal ureteral stone his recent CT. He has no pain and hasn't had any since 5/23.      ALLERGIES: Cipro TABS Penicillins PredniSONE TABS    MEDICATIONS: Aspirin 81 MG TABS Oral  Co Q 10 CAPS Oral  Fish Oil CAPS Oral  Fluticasone Propionate 50 MCG/ACT Nasal Suspension Nasal  Glucosamine CAPS Oral  Lipoic Acid powder  Lisinopril TABS Oral  MSM TABS Oral  Multi-Vitamin TABS Oral  RaNITidine HCl - 300 MG Oral Capsule Oral  Simvastatin 20 MG Oral Tablet Oral  Sotalol HCl - 120 MG Oral Tablet Oral  Tamsulosin HCl - 0.4 MG Oral Capsule 0 Oral Daily  Vanadyl Sulfate  Vitamin B-12 TABS Oral  Vitamin C TABS Oral     GU PSH: Cysto Bladder Stone <2.5cm - 2014 Cysto Bladder Stone >2.5cm - 2009 Cysto Uretero Lithotripsy - 2014 Cystoscopy Insert Stent - 2014 Renal ESWL - 2009      PSH Notes: Cystoscopy With  Fragmentation Of Bladder Calculus, Cystoscopy With Ureteroscopy With Lithotripsy, Cystoscopy With Insertion Of Ureteral Stent Left, Cystoscopy With Fragmentation Of Bladder Calculus Over 2.5cm, Tonsillectomy, Lithotripsy   NON-GU PSH: Remove Tonsils - 2009    GU PMH: Bladder Stone, Bladder calculus - 06/22/2015 Calculus Ureter, Calculus of right ureter - 06/22/2015, Proximal Ureteral Stone On The Left, - 2014 Hydronephrosis with renal and ureteral calculous obstruction, Hydronephrosis with obstructing calculus - 06/22/2015 Kidney Stone, Nephrolithiasis - 06/22/2015 Right lower quadrant pain, Abdominal pain, RLQ (right lower quadrant) - 06/22/2015 BPH w/LUTS, Benign prostatic hyperplasia with urinary obstruction - 11/30/2014 ED, arterial insufficiency, Erectile dysfunction due to arterial insufficiency - 11/30/2014 Elevated PSA, Elevated prostate specific antigen (PSA) - 11/30/2014 Urgency of urination, Urinary urgency - 11/30/2014 Dysuria, Dysuria - 2014 Personal Hx Oth male genital organs diseases, History of prostatitis - 2014 Urinary Retention, Unspec, Acute Urinary Retention - 2014    NON-GU PMH: Encounter for general adult medical examination without abnormal findings, Encounter for preventive health examination - 06/22/2015 Anxiety disorder, unspecified, Anxiety (Symptom) - 2014 Personal history of other diseases of the circulatory system, History of hypertension - 2014 Personal history of other endocrine, nutritional and metabolic disease, History of hypercholesterolemia - 2014, History of diabetes mellitus, - 2014 , Bladder Calculus - 2014, Heartburn With Regurgitation, - 2009, Arthritis, - 2009    FAMILY  HISTORY: Death In The Family Father - Runs In Family Death In The Family Mother - Runs In Family Diabetes - Mother Family Health Status - Father's Age - Runs In Family Family Health Status - Mother's Age - Runs In [facility] Family Health Status Number Of Children - Runs In  Family Heart Disease - Father, Brother Stroke Syndrome - Grandfather   SOCIAL HISTORY: Marital Status: Married     Notes: Never smoker, Chewing Nicotine-containing Substances, Marital History - Currently Married, Alcohol Use, Occupation:, Caffeine Use, Previous History Of Smoking, Tobacco Use   REVIEW OF SYSTEMS:    GU Review Male:   Patient reports frequent urination, hard to postpone urination, get up at night to urinate, leakage of urine, and erection problems. Patient denies burning/ pain with urination, stream starts and stops, trouble starting your stream, have to strain to urinate , and penile pain.  Gastrointestinal (Upper):   Patient denies nausea, vomiting, and indigestion/ heartburn.  Gastrointestinal (Lower):   Patient denies diarrhea and constipation.  Constitutional:   Patient denies fever, night sweats, weight loss, and fatigue.  Skin:   Patient denies skin rash/ lesion and itching.  Eyes:   Patient denies blurred vision and double vision.  Ears/ Nose/ Throat:   Hard of hearing. Patient denies sore throat and sinus problems.  Hematologic/Lymphatic:   Patient denies swollen glands and easy bruising.  Cardiovascular:   Patient denies leg swelling and chest pains.  Respiratory:   Patient denies cough and shortness of breath.  Endocrine:   Patient denies excessive thirst.  Musculoskeletal:   Patient reports joint pain. Patient denies back pain.  Neurological:   Patient denies headaches and dizziness.  Psychologic:   Patient denies depression and anxiety.   VITAL SIGNS:    Weight: 200 lb/90.7 kg   BP: 166/75 mmHg   Pulse: 63 /min   Temp: 98.9 F / 37 C      MULTI-SYSTEM PHYSICAL EXAMINATION:    Constitutional: Obese. No physical deformities. Normally developed. Good grooming.   Respiratory: No labored breathing, no use of accessory muscles.   Cardiovascular: Normal temperature, normal extremity pulses, no swelling, no varicosities.   Gastrointestinal: Obese abdomen. No  mass, no tenderness, no rigidity.    PAST DATA REVIEWED:  Source Of History:  Patient  X-Ray Review: C.T. Abdomen: Reviewed Films. Reviewed Report. Tiny bilateral renal stones and a right proximal stone that is 5x50mm C.T. Pelvis: Reviewed Films. Reviewed Report. Mx bladder stones up to 38mm and an enlarged prostate.     PROCEDURES:         KUB - 74000  A single view of the abdomen is obtained. The right ureteral stone is not clearly seen but could be at the iliac level or the UVJ. he has multiple bladder stones up to 39mm in size. Gas and soft tissue shadows are normal.   Bony Abnormalities:  He has severe lumbar degenerative disease.               Urinalysis - 81003 Dipstick Dipstick Cont'd  Specimen: Voided Bilirubin: Neg  Color: Yellow Ketones: Neg  Appearance: Clear Blood: Neg  Specific Gravity: 1.020 Protein: Neg  pH: 5.5 Urobilinogen: 0.2  Glucose: Neg Nitrites: Neg    Leukocyte Esterase: Neg    ASSESSMENT:      ICD-10 Details  1 GU:   BPH w/LUTS - N40.1 Worsening - He has a large obstructing prosate with primarily irritative symptoms.   2   Bladder Stone - N21.0 Worsening - He  has multiple bladder stones up to 12mm.   3   Calculus Ureter - N20.1 Improving - the ureteral stone has moved to either the iliac or UVJ level on KUB today but the location is not clear.    PLAN:           Schedule Return Visit: ASAP - Schedule Surgery  Return Notes: I am going to post him for cystoscopy, right RTG, possible right ureteroscopy with stent, cystolithalopaxy and TURP.           Document Letter(s):  Created for Patient: Clinical Summary         Notes:   I have reviewed the risks of the planned produres in detail.    His recent PSA returned at 6.9 and I added a prostate [place] and biopsy to his list of procedures.

## 2015-09-02 NOTE — Brief Op Note (Signed)
09/02/2015  12:11 PM  PATIENT:  Frank Hill  75 y.o. male  PRE-OPERATIVE DIAGNOSIS:  RIGHT URETERAL STONE BLADDER STONES AND BPH WITH BLADDER OUTLET OBSTRUCTION  POST-OPERATIVE DIAGNOSIS:  RIGHT URETERAL STONE BLADDER STONES AND BPH WITH BLADDER OUTLET OBSTRUCTION  PROCEDURE:  Procedure(s): CYSTOSCOPY WITH RIGHT RETROGRADE/URETEROSCOPY, CYSTOLITHOPAXY (Right) TRANSURETHRAL RESECTION OF THE PROSTATE (TURP) AND PROSTATE BIOPSY (N/A)  SURGEON:  Surgeon(s) and Role:    * Bjorn Pippin, MD - Primary  PHYSICIAN ASSISTANT:   ASSISTANTS: none   ANESTHESIA:   general  EBL:  Total I/O In: 1000 [I.V.:1000] Out: -   BLOOD ADMINISTERED:none  DRAINS: Urinary Catheter (Foley)   LOCAL MEDICATIONS USED:  NONE  SPECIMEN:  Source of Specimen:  prostate cores and chips and bladder stones.   DISPOSITION OF SPECIMEN:  prostate tissue to path.  stones to family.  COUNTS:  YES  TOURNIQUET:  * No tourniquets in log *  DICTATION: .Other Dictation: Dictation Number [id number]  PLAN OF CARE: Admit for overnight observation  PATIENT DISPOSITION:  PACU - hemodynamically stable.   Delay start of Pharmacological VTE agent (>24hrs) due to surgical blood loss or risk of bleeding: yes

## 2015-09-03 DIAGNOSIS — N210 Calculus in bladder: Secondary | ICD-10-CM | POA: Diagnosis present

## 2015-09-03 DIAGNOSIS — R972 Elevated prostate specific antigen [PSA]: Secondary | ICD-10-CM | POA: Diagnosis present

## 2015-09-03 DIAGNOSIS — N201 Calculus of ureter: Secondary | ICD-10-CM | POA: Diagnosis present

## 2015-09-03 LAB — GLUCOSE, CAPILLARY: Glucose-Capillary: 131 mg/dL — ABNORMAL HIGH (ref 65–99)

## 2015-09-03 MED ORDER — PNEUMOCOCCAL VAC POLYVALENT 25 MCG/0.5ML IJ INJ
0.5000 mL | INJECTION | Freq: Once | INTRAMUSCULAR | Status: AC
  Administered 2015-09-03: 0.5 mL via INTRAMUSCULAR
  Filled 2015-09-03: qty 0.5

## 2015-09-03 NOTE — Discharge Summary (Signed)
Physician Discharge Summary  Patient ID: Frank Hill MRN: [id number] DOB/AGE: [birth year] 75 y.o.  Admit date: 09/02/2015 Discharge date: 09/03/2015  Admission Diagnoses:  BPH (benign prostatic hypertrophy) with urinary obstruction  Discharge Diagnoses:  Principal Problem:   BPH (benign prostatic hypertrophy) with urinary obstruction Active Problems:   Elevated PSA   Bladder calculi   Ureteral calculus, right   Past Medical History:  Diagnosis Date  . Arthritis    osteoarthritis- hips  . Bladder stones   . Diabetes mellitus without complication (HCC)    no current meds  . Dysrhythmia    atrial flutter  . Endocarditis 2-3 yrs ago  . History of kidney stones    multiple times  . Hx of seasonal allergies   . Hypertension   . Impaired hearing    no hearing aids  . Rhinitis   . Snores    snores loudly on back    Surgeries: Procedure(s): CYSTOSCOPY WITH RIGHT RETROGRADE/URETEROSCOPY, CYSTOLITHOPAXY TRANSURETHRAL RESECTION OF THE PROSTATE (TURP) AND PROSTATE BIOPSY on 09/02/2015   Consultants (if any):   Discharged Condition: Improved  Hospital Course: Frank Hill is an 75 y.o. male who was admitted 09/02/2015 with a diagnosis of BPH (benign prostatic hypertrophy) with urinary obstruction and went to the operating room on 09/02/2015 and underwent the above named procedures.  He did well post op and his foley was removed this morning.   He was able to void and was felt to be ready for discharge home.    He was given perioperative antibiotics:  Anti-infectives    Start     Dose/Rate Route Frequency Ordered Stop   09/02/15 1145  gentamicin (GARAMYCIN) 460 mg in dextrose 5 % 100 mL IVPB     5 mg/kg  91.6 kg 111.5 mL/hr over 60 Minutes Intravenous  Once 09/02/15 1143 09/02/15 1215   09/02/15 0851  ceFAZolin (ANCEF) IVPB 2g/100 mL premix     2 g 200 mL/hr over 30 Minutes Intravenous 30 min pre-op 09/02/15 0851 09/02/15 1047    .  He was given sequential  compression devices,for DVT prophylaxis.  He benefited maximally from the hospital stay and there were no complications.    Recent vital signs:  Vitals:   09/02/15 2131 09/03/15 0545  BP: 124/73 114/86  Pulse: 61 60  Resp: 18 18  Temp: 98.7 F (37.1 C) 99 F (37.2 C)    Recent laboratory studies:  Lab Results  Component Value Date   HGB 13.1 08/31/2015   HGB 13.4 08/14/2012   Lab Results  Component Value Date   WBC 6.2 08/31/2015   PLT 203 08/31/2015   No results found for: INR Lab Results  Component Value Date   NA 142 08/31/2015   K 5.3 (H) 08/31/2015   CL 107 08/31/2015   CO2 28 08/31/2015   BUN 13 08/31/2015   CREATININE 1.02 08/31/2015   GLUCOSE 113 (H) 08/31/2015    Discharge Medications:     Medication List    STOP taking these medications   sulfamethoxazole-trimethoprim 800-160 MG tablet Commonly known as:  SEPTRA DS     TAKE these medications   Alpha-Lipoic Acid 200 MG Tabs Take 1 tablet by mouth 2 (two) times daily.   AMINO ACIDS COMPLEX PO Take 1 tablet by mouth 2 (two) times daily.   aspirin EC 81 MG tablet Take 81 mg by mouth daily.   aspirin-sod bicarb-citric acid 325 MG Tbef tablet Commonly known as:  ALKA-SELTZER Take 325 mg by  mouth every 6 (six) hours as needed (acid reflux).   belladonna-opium 16.2-30 MG suppository Commonly known as:  B&O SUPPRETTES Place 1 suppository rectally every 8 (eight) hours as needed for pain.   Co Q 10 100 MG Caps Take 1 capsule by mouth 2 (two) times daily.   fish oil-omega-3 fatty acids 1000 MG capsule Take 2 g by mouth 2 (two) times daily.   fluticasone 50 MCG/ACT nasal spray Commonly known as:  FLONASE Place 2 sprays into the nose 2 (two) times daily.   Garlic 200 MG Tabs Take 400 mg by mouth 2 (two) times daily.   Ginkgo Biloba 120 MG Tabs Take 120 mg by mouth daily.   Glucosamine Sulfate 1000 MG Caps Take 1,000 mg by mouth 2 (two) times daily.   HYALURONIC ACID PO Take 1 tablet by  mouth daily.   hyoscyamine 0.125 MG SL tablet Commonly known as:  LEVSIN/SL Place 1 tablet (0.125 mg total) under the tongue every 4 (four) hours as needed for cramping.   LECITHIN PO Take 1 tablet by mouth 2 (two) times daily.   lisinopril 20 MG tablet Commonly known as:  PRINIVIL,ZESTRIL Take 20 mg by mouth 2 (two) times daily.   metFORMIN 500 MG tablet Commonly known as:  GLUCOPHAGE Take 500 mg by mouth 2 (two) times daily with a meal.   MSM 1000 MG Tabs Take 1 tablet by mouth 2 (two) times daily.   multivitamin with minerals Tabs tablet Take 1 tablet by mouth daily.   niacin 500 MG tablet Commonly known as:  SLO-NIACIN Take 500 mg by mouth at bedtime.   phenazopyridine 200 MG tablet Commonly known as:  PYRIDIUM Take 1 tablet (200 mg total) by mouth 3 (three) times daily as needed for pain.   Psyllium Husk Powd Take 60 mg by mouth at bedtime.   ranitidine 300 MG capsule Commonly known as:  ZANTAC Take 300 mg by mouth 2 (two) times daily.   ROLAIDS MULTI-SYMPTOM PO Take 1 tablet by mouth every 6 (six) hours as needed (heartburn).   sildenafil 50 MG tablet Commonly known as:  VIAGRA Take 50 mg by mouth daily as needed for erectile dysfunction.   simvastatin 20 MG tablet Commonly known as:  ZOCOR Take 20 mg by mouth at bedtime.   sotalol 120 MG tablet Commonly known as:  BETAPACE Take 120 mg by mouth 2 (two) times daily.   tetrahydrozoline-zinc 0.05-0.25 % ophthalmic solution Commonly known as:  VISINE-AC Place 2 drops into both eyes 3 (three) times daily as needed (dry eyes).   VANADYL SULFATE PO Take 10 mg by mouth daily.   vitamin B-12 1000 MCG tablet Commonly known as:  CYANOCOBALAMIN Take 1,000 mcg by mouth daily.   vitamin C 1000 MG tablet Take 1,000 mg by mouth 2 (two) times daily.       Diagnostic Studies: No results found.  Disposition: 01-Home or Self Care  Discharge Instructions    Discontinue IV    Complete by:  As directed       Follow-up Information    Anner Crete, MD Follow up on 09/09/2015.   Specialty:  Urology Why:  1 Contact information: [address] [place] [address] [phone]            Signed: Anner Crete 09/03/2015, 6:52 AM

## 2015-09-03 NOTE — Progress Notes (Signed)
Pt discharged from the unit via wheelchair. Discharge instructions were reviewed with the pt. Pneumococcal vaccine given to the pt prior to discharge. No questions or concerns from the pt or family members at this time.  Danell Verno W Lennis Rader, RN

## 2015-09-09 DIAGNOSIS — N401 Enlarged prostate with lower urinary tract symptoms: Secondary | ICD-10-CM | POA: Diagnosis not present

## 2015-09-09 DIAGNOSIS — R3915 Urgency of urination: Secondary | ICD-10-CM | POA: Diagnosis not present

## 2015-11-19 DIAGNOSIS — R5383 Other fatigue: Secondary | ICD-10-CM | POA: Diagnosis not present

## 2015-11-19 DIAGNOSIS — E119 Type 2 diabetes mellitus without complications: Secondary | ICD-10-CM | POA: Diagnosis not present

## 2015-11-23 DIAGNOSIS — K219 Gastro-esophageal reflux disease without esophagitis: Secondary | ICD-10-CM | POA: Diagnosis not present

## 2015-11-23 DIAGNOSIS — E119 Type 2 diabetes mellitus without complications: Secondary | ICD-10-CM | POA: Diagnosis not present

## 2015-11-23 DIAGNOSIS — I10 Essential (primary) hypertension: Secondary | ICD-10-CM | POA: Diagnosis not present

## 2015-12-13 DIAGNOSIS — I10 Essential (primary) hypertension: Secondary | ICD-10-CM | POA: Diagnosis not present

## 2015-12-13 DIAGNOSIS — E7800 Pure hypercholesterolemia, unspecified: Secondary | ICD-10-CM | POA: Diagnosis not present

## 2015-12-13 DIAGNOSIS — I4892 Unspecified atrial flutter: Secondary | ICD-10-CM | POA: Diagnosis not present

## 2015-12-20 DIAGNOSIS — R351 Nocturia: Secondary | ICD-10-CM | POA: Diagnosis not present

## 2015-12-20 DIAGNOSIS — N200 Calculus of kidney: Secondary | ICD-10-CM | POA: Diagnosis not present

## 2015-12-20 DIAGNOSIS — N5201 Erectile dysfunction due to arterial insufficiency: Secondary | ICD-10-CM | POA: Diagnosis not present

## 2015-12-20 DIAGNOSIS — N401 Enlarged prostate with lower urinary tract symptoms: Secondary | ICD-10-CM | POA: Diagnosis not present

## 2016-03-03 ENCOUNTER — Ambulatory Visit (HOSPITAL_COMMUNITY)
Admission: RE | Admit: 2016-03-03 | Discharge: 2016-03-03 | Disposition: A | Discharge status: Home / Self Care | Payer: PPO | Source: Ambulatory Visit | Attending: Physician Assistant | Admitting: Physician Assistant

## 2016-03-03 ENCOUNTER — Other Ambulatory Visit (HOSPITAL_COMMUNITY): Payer: Self-pay | Admitting: Physician Assistant

## 2016-03-03 DIAGNOSIS — M5136 Other intervertebral disc degeneration, lumbar region: Secondary | ICD-10-CM | POA: Diagnosis not present

## 2016-03-03 DIAGNOSIS — M545 Low back pain, unspecified: Secondary | ICD-10-CM

## 2016-03-03 DIAGNOSIS — Z01818 Encounter for other preprocedural examination: Secondary | ICD-10-CM | POA: Diagnosis not present

## 2016-03-03 DIAGNOSIS — M1612 Unilateral primary osteoarthritis, left hip: Secondary | ICD-10-CM | POA: Diagnosis not present

## 2016-03-11 DIAGNOSIS — M545 Low back pain: Secondary | ICD-10-CM | POA: Diagnosis not present

## 2016-03-23 DIAGNOSIS — M5136 Other intervertebral disc degeneration, lumbar region: Secondary | ICD-10-CM | POA: Diagnosis not present

## 2016-03-23 DIAGNOSIS — M9983 Other biomechanical lesions of lumbar region: Secondary | ICD-10-CM | POA: Diagnosis not present

## 2016-03-23 DIAGNOSIS — M1612 Unilateral primary osteoarthritis, left hip: Secondary | ICD-10-CM | POA: Diagnosis not present

## 2016-04-13 DIAGNOSIS — M1612 Unilateral primary osteoarthritis, left hip: Secondary | ICD-10-CM | POA: Diagnosis not present

## 2016-04-18 DIAGNOSIS — M9983 Other biomechanical lesions of lumbar region: Secondary | ICD-10-CM | POA: Diagnosis not present

## 2016-04-18 DIAGNOSIS — M545 Low back pain: Secondary | ICD-10-CM | POA: Diagnosis not present

## 2016-04-18 DIAGNOSIS — M5136 Other intervertebral disc degeneration, lumbar region: Secondary | ICD-10-CM | POA: Diagnosis not present

## 2016-04-26 DIAGNOSIS — M9983 Other biomechanical lesions of lumbar region: Secondary | ICD-10-CM | POA: Diagnosis not present

## 2016-04-26 DIAGNOSIS — M5136 Other intervertebral disc degeneration, lumbar region: Secondary | ICD-10-CM | POA: Diagnosis not present

## 2016-05-08 DIAGNOSIS — G933 Postviral fatigue syndrome: Secondary | ICD-10-CM | POA: Diagnosis not present

## 2016-05-08 DIAGNOSIS — D511 Vitamin B12 deficiency anemia due to selective vitamin B12 malabsorption with proteinuria: Secondary | ICD-10-CM | POA: Diagnosis not present

## 2016-05-08 DIAGNOSIS — E119 Type 2 diabetes mellitus without complications: Secondary | ICD-10-CM | POA: Diagnosis not present

## 2016-05-08 DIAGNOSIS — E785 Hyperlipidemia, unspecified: Secondary | ICD-10-CM | POA: Diagnosis not present

## 2016-05-08 DIAGNOSIS — Z125 Encounter for screening for malignant neoplasm of prostate: Secondary | ICD-10-CM | POA: Diagnosis not present

## 2016-05-08 DIAGNOSIS — Z09 Encounter for follow-up examination after completed treatment for conditions other than malignant neoplasm: Secondary | ICD-10-CM | POA: Diagnosis not present

## 2016-05-08 DIAGNOSIS — R5383 Other fatigue: Secondary | ICD-10-CM | POA: Diagnosis not present

## 2016-05-15 DIAGNOSIS — M48062 Spinal stenosis, lumbar region with neurogenic claudication: Secondary | ICD-10-CM | POA: Diagnosis not present

## 2016-05-17 ENCOUNTER — Other Ambulatory Visit: Payer: Self-pay | Admitting: Neurological Surgery

## 2016-05-17 DIAGNOSIS — I10 Essential (primary) hypertension: Secondary | ICD-10-CM | POA: Diagnosis not present

## 2016-05-17 DIAGNOSIS — E119 Type 2 diabetes mellitus without complications: Secondary | ICD-10-CM | POA: Diagnosis not present

## 2016-05-17 DIAGNOSIS — R05 Cough: Secondary | ICD-10-CM | POA: Diagnosis not present

## 2016-05-17 DIAGNOSIS — M25552 Pain in left hip: Secondary | ICD-10-CM | POA: Diagnosis not present

## 2016-05-31 ENCOUNTER — Ambulatory Visit (HOSPITAL_COMMUNITY)
Admission: RE | Admit: 2016-05-31 | Discharge: 2016-05-31 | Disposition: A | Discharge status: Home / Self Care | Payer: PPO | Source: Ambulatory Visit | Attending: Neurological Surgery | Admitting: Neurological Surgery

## 2016-05-31 ENCOUNTER — Encounter (HOSPITAL_COMMUNITY): Payer: Self-pay

## 2016-05-31 ENCOUNTER — Encounter (HOSPITAL_COMMUNITY)
Admission: RE | Admit: 2016-05-31 | Discharge: 2016-05-31 | Disposition: A | Discharge status: Home / Self Care | Payer: PPO | Source: Ambulatory Visit | Attending: Neurological Surgery | Admitting: Neurological Surgery

## 2016-05-31 DIAGNOSIS — Z01810 Encounter for preprocedural cardiovascular examination: Secondary | ICD-10-CM | POA: Diagnosis not present

## 2016-05-31 DIAGNOSIS — I10 Essential (primary) hypertension: Secondary | ICD-10-CM | POA: Insufficient documentation

## 2016-05-31 DIAGNOSIS — Z01812 Encounter for preprocedural laboratory examination: Secondary | ICD-10-CM | POA: Insufficient documentation

## 2016-05-31 DIAGNOSIS — Z79899 Other long term (current) drug therapy: Secondary | ICD-10-CM | POA: Diagnosis not present

## 2016-05-31 DIAGNOSIS — E119 Type 2 diabetes mellitus without complications: Secondary | ICD-10-CM | POA: Insufficient documentation

## 2016-05-31 DIAGNOSIS — M48061 Spinal stenosis, lumbar region without neurogenic claudication: Secondary | ICD-10-CM

## 2016-05-31 DIAGNOSIS — R918 Other nonspecific abnormal finding of lung field: Secondary | ICD-10-CM | POA: Diagnosis not present

## 2016-05-31 LAB — CBC WITH DIFFERENTIAL/PLATELET
BASOS ABS: 0 10*3/uL (ref 0.0–0.1)
BASOS PCT: 0 %
EOS ABS: 0.2 10*3/uL (ref 0.0–0.7)
Eosinophils Relative: 3 %
HEMATOCRIT: 37.7 % — AB (ref 39.0–52.0)
HEMOGLOBIN: 12.9 g/dL — AB (ref 13.0–17.0)
Lymphocytes Relative: 21 %
Lymphs Abs: 1.5 10*3/uL (ref 0.7–4.0)
MCH: 30.9 pg (ref 26.0–34.0)
MCHC: 34.2 g/dL (ref 30.0–36.0)
MCV: 90.4 fL (ref 78.0–100.0)
MONO ABS: 0.6 10*3/uL (ref 0.1–1.0)
MONOS PCT: 8 %
NEUTROS ABS: 4.8 10*3/uL (ref 1.7–7.7)
NEUTROS PCT: 68 %
Platelets: 199 10*3/uL (ref 150–400)
RBC: 4.17 MIL/uL — ABNORMAL LOW (ref 4.22–5.81)
RDW: 13.5 % (ref 11.5–15.5)
WBC: 7 10*3/uL (ref 4.0–10.5)

## 2016-05-31 LAB — BASIC METABOLIC PANEL
ANION GAP: 9 (ref 5–15)
BUN: 14 mg/dL (ref 6–20)
CALCIUM: 9.6 mg/dL (ref 8.9–10.3)
CO2: 23 mmol/L (ref 22–32)
CREATININE: 0.98 mg/dL (ref 0.61–1.24)
Chloride: 105 mmol/L (ref 101–111)
Glucose, Bld: 129 mg/dL — ABNORMAL HIGH (ref 65–99)
Potassium: 4.5 mmol/L (ref 3.5–5.1)
Sodium: 137 mmol/L (ref 135–145)

## 2016-05-31 LAB — SURGICAL PCR SCREEN
MRSA, PCR: NEGATIVE
Staphylococcus aureus: NEGATIVE

## 2016-05-31 LAB — PROTIME-INR
INR: 1.02
PROTHROMBIN TIME: 13.4 s (ref 11.4–15.2)

## 2016-05-31 LAB — GLUCOSE, CAPILLARY: Glucose-Capillary: 115 mg/dL — ABNORMAL HIGH (ref 65–99)

## 2016-05-31 NOTE — Progress Notes (Addendum)
PCP - MarkSusa Loffler- [facility] Cardiologist - Rebecca Eaton - [facility]  Chest x-ray - 05/31/16 EKG - requesting from cardiologist had one two weeks ago Stress Test - denies ECHO - denies Cardiac Cath - denies  Records requested sending to anesthesia   Does not check is blood sugar at home   Patient denies shortness of breath, fever, cough and chest pain at PAT appointment   Patient verbalized understanding of instructions that was given to them at the PAT appointment. Patient expressed that there were no further questions.  Patient was also instructed that they will need to review over the PAT instructions again at home before the surgery.

## 2016-05-31 NOTE — Pre-Procedure Instructions (Signed)
Frank Hill  05/31/2016      [facility] - [place], [place] - [address] [place] [address] Phone: [phone] Fax: [phone]  [facility] - [place], [place] - [address] [place] [address] Phone: [phone] Fax: [phone]    Your procedure is scheduled on May 10  Report to [facility] Admitting at 0745 A.M.  Call this number if you have problems the morning of surgery:  [phone]   Remember:  Do not eat food or drink liquids after midnight.   Take these medicines the morning of surgery with A SIP OF WATER acetaminophen (TYLENOL), fluticasone (FLONASE), ranitidine (ZANTAC), sotalol (BETAPACE), eye drops  7 days prior to surgery STOP taking any Aspirin, Aleve, Naproxen, Ibuprofen, Motrin, Advil, Goody's, BC's, all herbal medications, fish oil, and all vitamins    WHAT DO I DO ABOUT MY DIABETES MEDICATION?   Marland Kitchen Do not take oral diabetes medicines (pills) the morning of surgery. metFORMIN (GLUCOPHAGE)    How to Manage Your Diabetes Before and After Surgery  Why is it important to control my blood sugar before and after surgery? . Improving blood sugar levels before and after surgery helps healing and can limit problems. . A way of improving blood sugar control is eating a healthy diet by: o  Eating less sugar and carbohydrates o  Increasing activity/exercise o  Talking with your doctor about reaching your blood sugar goals . High blood sugars (greater than 180 mg/dL) can raise your risk of infections and slow your recovery, so you will need to focus on controlling your diabetes during the weeks before surgery. . Make sure that the doctor who takes care of your diabetes knows about your planned surgery including the date and location.  How do I manage my blood sugar before surgery? . Check your blood sugar at least 4 times a day, starting 2 days before surgery, to  make sure that the level is not too high or low. o Check your blood sugar the morning of your surgery when you wake up and every 2 hours until you get to the Short Stay unit. . If your blood sugar is less than 70 mg/dL, you will need to treat for low blood sugar: o Do not take insulin. o Treat a low blood sugar (less than 70 mg/dL) with  cup of clear juice (cranberry or apple), 4 glucose tablets, OR glucose gel. o Recheck blood sugar in 15 minutes after treatment (to make sure it is greater than 70 mg/dL). If your blood sugar is not greater than 70 mg/dL on recheck, call [phone] for further instructions. . Report your blood sugar to the short stay nurse when you get to Short Stay.  . If you are admitted to the hospital after surgery: o Your blood sugar will be checked by the staff and you will probably be given insulin after surgery (instead of oral diabetes medicines) to make sure you have good blood sugar levels. o The goal for blood sugar control after surgery is 80-180 mg/dL.    Do not wear jewelry  Do not wear lotions, powders, or cologne, or deoderant.  Men may shave face and neck.  Do not bring valuables to the hospital.  [facility] is not responsible for any belongings or valuables.  Contacts, dentures or bridgework may not be worn into surgery.  Leave your suitcase in the car.  After  surgery it may be brought to your room.  For patients admitted to the hospital, discharge time will be determined by your treatment team.  Patients discharged the day of surgery will not be allowed to drive home.    Special instructions:   [facility]- Preparing For Surgery  Before surgery, you can play an important role. Because skin is not sterile, your skin needs to be as free of germs as possible. You can reduce the number of germs on your skin by washing with CHG (chlorahexidine gluconate) Soap before surgery.  CHG is an antiseptic cleaner which kills germs and bonds with the skin to  continue killing germs even after washing.  Please do not use if you have an allergy to CHG or antibacterial soaps. If your skin becomes reddened/irritated stop using the CHG.  Do not shave (including legs and underarms) for at least 48 hours prior to first CHG shower. It is OK to shave your face.  Please follow these instructions carefully.   1. Shower the NIGHT BEFORE SURGERY and the MORNING OF SURGERY with CHG.   2. If you chose to wash your hair, wash your hair first as usual with your normal shampoo.  3. After you shampoo, rinse your hair and body thoroughly to remove the shampoo.  4. Use CHG as you would any other liquid soap. You can apply CHG directly to the skin and wash gently with a scrungie or a clean washcloth.   5. Apply the CHG Soap to your body ONLY FROM THE NECK DOWN.  Do not use on open wounds or open sores. Avoid contact with your eyes, ears, mouth and genitals (private parts). Wash genitals (private parts) with your normal soap.  6. Wash thoroughly, paying special attention to the area where your surgery will be performed.  7. Thoroughly rinse your body with warm water from the neck down.  8. DO NOT shower/wash with your normal soap after using and rinsing off the CHG Soap.  9. Pat yourself dry with a CLEAN TOWEL.   10. Wear CLEAN PAJAMAS   11. Place CLEAN SHEETS on your bed the night of your first shower and DO NOT SLEEP WITH PETS.    Day of Surgery: Do not apply any deodorants/lotions. Please wear clean clothes to the hospital/surgery center.      Please read over the following fact sheets that you were given.

## 2016-06-01 LAB — HEMOGLOBIN A1C
HEMOGLOBIN A1C: 7 % — AB (ref 4.8–5.6)
Mean Plasma Glucose: 154 mg/dL

## 2016-06-02 ENCOUNTER — Encounter (HOSPITAL_COMMUNITY): Payer: Self-pay

## 2016-06-02 NOTE — Progress Notes (Addendum)
Anesthesia Chart Review: Patient is a 76 year old male scheduled for laminectomy and foraminotomy L2-3, L3-4, L4-5, left on 06/08/2016 by Dr. Yetta BarreJones.  History includes former smoker (quit '80), hypertension, diabetes mellitus type 2 (diet controlled), atrial flutter in the setting of pericarditis (> 4 years ago), snoring, arthritis, hard of hearing, tonsillectomy, nephrolithiasis, s/p TURP 09/02/15.  PCP is Dr. Susa LofflerMark Beck with [facility]. Cardiologist is Dr. Tollie PizzaKurt Daniel [facility] (see Care Everywhere), last visit 12/13/15. One year follow-up recommended.  Meds include aspirin 81 mg, omega fish oil, Flonase, lisinopril, metformin, niacin, Zantac, Viagra, Zocor, Betapace. He is on several supplements include Vanadyl Sulfate, methylsulfonylmethane, Lecithin, hyaluronic acid, ginkgo biloba, garlic, amino acids complex.  BP (!) 180/92   Pulse 81   Temp 36.7 C   Resp 20   Ht 5\' 9"  (1.753 m)   Wt 199 lb 12.8 oz (90.6 kg)   SpO2 100%   BMI 29.51 kg/m  Unfortunately BP was not rechecked at PAT. BP was 140/82 at 12/13/15 cardiology visit.   EKG 12/13/15 [facility]): SB at 59 bpm, low voltage in precordial leads.   CXR 05/31/16: IMPRESSION: 1. No evidence of active disease. 2. 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 3. Osteophytes with diffuse thoracic ankylosis  Preoperative labs noted. Cr 0.98. H/H 12.9/37.7. A1c 7.0.  Patient tolerated urologic surgery < 1 year ago. No new testing at his cardiology follow-up six months ago. He maintained SR. I did notify Erie NoeVanessa at Dr. Yetta BarreJones' office of patient's elevated BP at PAT. Patient will get a BP check on arrival the day of surgery. If result is acceptable and otherwise no significant changes then I would anticipate that he could proceed as planned.  Velna Ochsllison Abryanna Musolino, PA-C [facility]Anesthesiology Phone [phone] 06/05/2016 9:37 AM

## 2016-06-05 DIAGNOSIS — I10 Essential (primary) hypertension: Secondary | ICD-10-CM | POA: Diagnosis not present

## 2016-06-05 DIAGNOSIS — I4892 Unspecified atrial flutter: Secondary | ICD-10-CM | POA: Diagnosis not present

## 2016-06-05 DIAGNOSIS — E7800 Pure hypercholesterolemia, unspecified: Secondary | ICD-10-CM | POA: Diagnosis not present

## 2016-06-08 ENCOUNTER — Encounter (HOSPITAL_COMMUNITY): Payer: Self-pay | Admitting: Anesthesiology

## 2016-06-08 ENCOUNTER — Observation Stay (HOSPITAL_COMMUNITY)
Admission: RE | Admit: 2016-06-08 | Discharge: 2016-06-09 | Disposition: A | Discharge status: Home / Self Care | Payer: PPO | Source: Ambulatory Visit | Attending: Neurological Surgery | Admitting: Neurological Surgery

## 2016-06-08 ENCOUNTER — Encounter (HOSPITAL_COMMUNITY)
Admission: RE | Disposition: A | Discharge status: Home / Self Care | Payer: Self-pay | Source: Ambulatory Visit | Attending: Neurological Surgery

## 2016-06-08 ENCOUNTER — Inpatient Hospital Stay (HOSPITAL_COMMUNITY): Payer: PPO | Admitting: Certified Registered"

## 2016-06-08 ENCOUNTER — Inpatient Hospital Stay (HOSPITAL_COMMUNITY): Payer: PPO | Admitting: Vascular Surgery

## 2016-06-08 ENCOUNTER — Inpatient Hospital Stay (HOSPITAL_COMMUNITY): Payer: PPO

## 2016-06-08 DIAGNOSIS — Z79899 Other long term (current) drug therapy: Secondary | ICD-10-CM | POA: Insufficient documentation

## 2016-06-08 DIAGNOSIS — Z7984 Long term (current) use of oral hypoglycemic drugs: Secondary | ICD-10-CM | POA: Insufficient documentation

## 2016-06-08 DIAGNOSIS — M5126 Other intervertebral disc displacement, lumbar region: Secondary | ICD-10-CM | POA: Insufficient documentation

## 2016-06-08 DIAGNOSIS — Z9889 Other specified postprocedural states: Secondary | ICD-10-CM

## 2016-06-08 DIAGNOSIS — N401 Enlarged prostate with lower urinary tract symptoms: Secondary | ICD-10-CM | POA: Diagnosis not present

## 2016-06-08 DIAGNOSIS — I10 Essential (primary) hypertension: Secondary | ICD-10-CM | POA: Insufficient documentation

## 2016-06-08 DIAGNOSIS — N138 Other obstructive and reflux uropathy: Secondary | ICD-10-CM | POA: Diagnosis not present

## 2016-06-08 DIAGNOSIS — E119 Type 2 diabetes mellitus without complications: Secondary | ICD-10-CM | POA: Insufficient documentation

## 2016-06-08 DIAGNOSIS — Z7982 Long term (current) use of aspirin: Secondary | ICD-10-CM | POA: Diagnosis not present

## 2016-06-08 DIAGNOSIS — Z419 Encounter for procedure for purposes other than remedying health state, unspecified: Secondary | ICD-10-CM

## 2016-06-08 DIAGNOSIS — M48061 Spinal stenosis, lumbar region without neurogenic claudication: Secondary | ICD-10-CM | POA: Diagnosis not present

## 2016-06-08 DIAGNOSIS — M5116 Intervertebral disc disorders with radiculopathy, lumbar region: Secondary | ICD-10-CM | POA: Diagnosis not present

## 2016-06-08 DIAGNOSIS — Z87891 Personal history of nicotine dependence: Secondary | ICD-10-CM | POA: Insufficient documentation

## 2016-06-08 HISTORY — PX: LUMBAR LAMINECTOMY/DECOMPRESSION MICRODISCECTOMY: SHX5026

## 2016-06-08 LAB — TROPONIN I: Troponin I: <0.03 ng/mL (ref <0.03)

## 2016-06-08 LAB — GLUCOSE, CAPILLARY
GLUCOSE-CAPILLARY: 148 mg/dL — AB (ref 65–99)
GLUCOSE-CAPILLARY: 130 mg/dL — AB (ref 65–99)
Glucose-Capillary: 138 mg/dL — ABNORMAL HIGH (ref 65–99)
Glucose-Capillary: 186 mg/dL — ABNORMAL HIGH (ref 65–99)

## 2016-06-08 SURGERY — LUMBAR LAMINECTOMY/DECOMPRESSION MICRODISCECTOMY 3 LEVELS
Anesthesia: General | Site: Back | Laterality: Left

## 2016-06-08 MED ORDER — MIDAZOLAM HCL 2 MG/2ML IJ SOLN
INTRAMUSCULAR | Status: AC
  Filled 2016-06-08: qty 2

## 2016-06-08 MED ORDER — LISINOPRIL 20 MG PO TABS
20.0000 mg | ORAL_TABLET | Freq: Two times a day (BID) | ORAL | Status: DC
  Filled 2016-06-08: qty 1

## 2016-06-08 MED ORDER — SOTALOL HCL 80 MG PO TABS
120.0000 mg | ORAL_TABLET | Freq: Two times a day (BID) | ORAL | Status: DC
  Filled 2016-06-08: qty 2

## 2016-06-08 MED ORDER — ROCURONIUM BROMIDE 100 MG/10ML IV SOLN
INTRAVENOUS | Status: DC | PRN
  Administered 2016-06-08: 50 mg via INTRAVENOUS

## 2016-06-08 MED ORDER — METHOCARBAMOL 500 MG PO TABS: 500.0000 mg | ORAL_TABLET | Freq: Four times a day (QID) | ORAL | Status: DC | PRN

## 2016-06-08 MED ORDER — DOXAZOSIN MESYLATE 2 MG PO TABS
2.0000 mg | ORAL_TABLET | Freq: Every day | ORAL | Status: DC
  Filled 2016-06-08: qty 1

## 2016-06-08 MED ORDER — ONDANSETRON HCL 4 MG PO TABS: 4.0000 mg | ORAL_TABLET | Freq: Four times a day (QID) | ORAL | Status: DC | PRN

## 2016-06-08 MED ORDER — INSULIN ASPART 100 UNIT/ML ~~LOC~~ SOLN
0.0000 [IU] | Freq: Three times a day (TID) | SUBCUTANEOUS | Status: DC
  Administered 2016-06-08: 3 [IU] via SUBCUTANEOUS

## 2016-06-08 MED ORDER — SODIUM CHLORIDE 0.9% FLUSH: 3.0000 mL | Freq: Two times a day (BID) | INTRAVENOUS | Status: DC

## 2016-06-08 MED ORDER — PROPOFOL 10 MG/ML IV BOLUS
INTRAVENOUS | Status: AC
  Filled 2016-06-08: qty 20

## 2016-06-08 MED ORDER — BUPIVACAINE HCL (PF) 0.25 % IJ SOLN
INTRAMUSCULAR | Status: DC | PRN
  Administered 2016-06-08: 5 mL

## 2016-06-08 MED ORDER — LIDOCAINE HCL (CARDIAC) 20 MG/ML IV SOLN
INTRAVENOUS | Status: DC | PRN
  Administered 2016-06-08: 100 mg via INTRAVENOUS

## 2016-06-08 MED ORDER — HYDROMORPHONE HCL 1 MG/ML IJ SOLN
INTRAMUSCULAR | Status: AC
  Administered 2016-06-08: 0.5 mg via INTRAVENOUS
  Filled 2016-06-08: qty 1

## 2016-06-08 MED ORDER — VANCOMYCIN HCL IN DEXTROSE 1-5 GM/200ML-% IV SOLN
1000.0000 mg | INTRAVENOUS | Status: AC
  Administered 2016-06-08: 1000 mg via INTRAVENOUS
  Filled 2016-06-08: qty 200

## 2016-06-08 MED ORDER — PROPOFOL 10 MG/ML IV BOLUS
INTRAVENOUS | Status: DC | PRN
  Administered 2016-06-08: 140 mg via INTRAVENOUS

## 2016-06-08 MED ORDER — POTASSIUM CHLORIDE IN NACL 20-0.9 MEQ/L-% IV SOLN
INTRAVENOUS | Status: DC
  Administered 2016-06-08: 16:00:00 via INTRAVENOUS
  Filled 2016-06-08 (×2): qty 1000

## 2016-06-08 MED ORDER — LACTATED RINGERS IV SOLN
INTRAVENOUS | Status: DC | PRN
  Administered 2016-06-08 (×2): via INTRAVENOUS

## 2016-06-08 MED ORDER — HYDROMORPHONE HCL 1 MG/ML IJ SOLN
0.2500 mg | INTRAMUSCULAR | Status: DC | PRN
  Administered 2016-06-08 (×2): 0.5 mg via INTRAVENOUS

## 2016-06-08 MED ORDER — ACETAMINOPHEN 650 MG RE SUPP: 650.0000 mg | RECTAL | Status: DC | PRN

## 2016-06-08 MED ORDER — LACTATED RINGERS IV SOLN
INTRAVENOUS | Status: DC
  Administered 2016-06-08: 50 mL/h via INTRAVENOUS

## 2016-06-08 MED ORDER — CHLORTHALIDONE 25 MG PO TABS
25.0000 mg | ORAL_TABLET | Freq: Every day | ORAL | Status: DC
  Filled 2016-06-08: qty 1

## 2016-06-08 MED ORDER — HYDROCODONE-ACETAMINOPHEN 7.5-325 MG PO TABS: 1.0000 | ORAL_TABLET | Freq: Four times a day (QID) | ORAL | Status: DC

## 2016-06-08 MED ORDER — DIPHENHYDRAMINE HCL 50 MG/ML IJ SOLN
INTRAMUSCULAR | Status: AC
  Administered 2016-06-08: 12.5 mg via INTRAVENOUS
  Filled 2016-06-08: qty 1

## 2016-06-08 MED ORDER — SODIUM CHLORIDE 0.9% FLUSH: 3.0000 mL | INTRAVENOUS | Status: DC | PRN

## 2016-06-08 MED ORDER — THROMBIN 5000 UNITS EX SOLR
CUTANEOUS | Status: AC
  Filled 2016-06-08: qty 15000

## 2016-06-08 MED ORDER — MEPERIDINE HCL 25 MG/ML IJ SOLN: 6.2500 mg | INTRAMUSCULAR | Status: DC | PRN

## 2016-06-08 MED ORDER — METHOCARBAMOL 1000 MG/10ML IJ SOLN
500.0000 mg | Freq: Four times a day (QID) | INTRAVENOUS | Status: DC | PRN
  Filled 2016-06-08: qty 5

## 2016-06-08 MED ORDER — ASPIRIN EC 81 MG PO TBEC
81.0000 mg | DELAYED_RELEASE_TABLET | Freq: Every day | ORAL | Status: DC
  Administered 2016-06-08 – 2016-06-09 (×2): 81 mg via ORAL
  Filled 2016-06-08 (×2): qty 1

## 2016-06-08 MED ORDER — 0.9 % SODIUM CHLORIDE (POUR BTL) OPTIME
TOPICAL | Status: DC | PRN
  Administered 2016-06-08: 1000 mL

## 2016-06-08 MED ORDER — SENNA 8.6 MG PO TABS
1.0000 | ORAL_TABLET | Freq: Two times a day (BID) | ORAL | Status: DC
  Administered 2016-06-08 – 2016-06-09 (×2): 8.6 mg via ORAL
  Filled 2016-06-08 (×2): qty 1

## 2016-06-08 MED ORDER — ALBUMIN HUMAN 5 % IV SOLN
INTRAVENOUS | Status: AC
  Administered 2016-06-08: 12.5 g via INTRAVENOUS
  Filled 2016-06-08: qty 250

## 2016-06-08 MED ORDER — THROMBIN 5000 UNITS EX SOLR
OROMUCOSAL | Status: DC | PRN
  Administered 2016-06-08: 08:00:00 via TOPICAL

## 2016-06-08 MED ORDER — CHLORHEXIDINE GLUCONATE CLOTH 2 % EX PADS: 6.0000 | MEDICATED_PAD | Freq: Once | CUTANEOUS | Status: DC

## 2016-06-08 MED ORDER — ORAL CARE MOUTH RINSE
15.0000 mL | Freq: Two times a day (BID) | OROMUCOSAL | Status: DC
  Administered 2016-06-08: 15 mL via OROMUCOSAL

## 2016-06-08 MED ORDER — MSM 1000 MG PO TABS: 1.0000 | ORAL_TABLET | Freq: Every day | ORAL | Status: DC

## 2016-06-08 MED ORDER — ONDANSETRON HCL 4 MG/2ML IJ SOLN
INTRAMUSCULAR | Status: DC | PRN
  Administered 2016-06-08: 4 mg via INTRAVENOUS

## 2016-06-08 MED ORDER — ALBUMIN HUMAN 5 % IV SOLN
12.5000 g | Freq: Once | INTRAVENOUS | Status: AC
  Administered 2016-06-08: 12.5 g via INTRAVENOUS

## 2016-06-08 MED ORDER — METFORMIN HCL 500 MG PO TABS
500.0000 mg | ORAL_TABLET | Freq: Two times a day (BID) | ORAL | Status: DC
Start: 2016-06-08 — End: 2016-06-09
  Administered 2016-06-08 – 2016-06-09 (×2): 500 mg via ORAL
  Filled 2016-06-08 (×2): qty 1

## 2016-06-08 MED ORDER — DEXTROSE 5 % IV SOLN
INTRAVENOUS | Status: DC | PRN
  Administered 2016-06-08: 60 ug/min via INTRAVENOUS

## 2016-06-08 MED ORDER — SODIUM CHLORIDE 0.9 % IV SOLN
0.0000 ug/min | INTRAVENOUS | Status: DC
  Administered 2016-06-08: 20 ug/min via INTRAVENOUS
  Filled 2016-06-08: qty 1

## 2016-06-08 MED ORDER — MORPHINE SULFATE (PF) 4 MG/ML IV SOLN: 2.0000 mg | INTRAVENOUS | Status: DC | PRN

## 2016-06-08 MED ORDER — MENTHOL 3 MG MT LOZG: 1.0000 | LOZENGE | OROMUCOSAL | Status: DC | PRN

## 2016-06-08 MED ORDER — ONDANSETRON HCL 4 MG/2ML IJ SOLN: 4.0000 mg | Freq: Four times a day (QID) | INTRAMUSCULAR | Status: DC | PRN

## 2016-06-08 MED ORDER — FENTANYL CITRATE (PF) 250 MCG/5ML IJ SOLN
INTRAMUSCULAR | Status: AC
  Filled 2016-06-08: qty 5

## 2016-06-08 MED ORDER — HEMOSTATIC AGENTS (NO CHARGE) OPTIME
TOPICAL | Status: DC | PRN
  Administered 2016-06-08: 1 via TOPICAL

## 2016-06-08 MED ORDER — CELECOXIB 200 MG PO CAPS
200.0000 mg | ORAL_CAPSULE | Freq: Two times a day (BID) | ORAL | Status: DC
  Filled 2016-06-08 (×2): qty 1

## 2016-06-08 MED ORDER — SUGAMMADEX SODIUM 200 MG/2ML IV SOLN
INTRAVENOUS | Status: DC | PRN
  Administered 2016-06-08: 200 mg via INTRAVENOUS

## 2016-06-08 MED ORDER — VANCOMYCIN HCL IN DEXTROSE 1-5 GM/200ML-% IV SOLN
1000.0000 mg | Freq: Once | INTRAVENOUS | Status: DC
  Filled 2016-06-08: qty 200

## 2016-06-08 MED ORDER — DIPHENHYDRAMINE HCL 50 MG/ML IJ SOLN
12.5000 mg | Freq: Once | INTRAMUSCULAR | Status: AC
  Administered 2016-06-08: 12.5 mg via INTRAVENOUS

## 2016-06-08 MED ORDER — THROMBIN 5000 UNITS EX SOLR
CUTANEOUS | Status: DC | PRN
  Administered 2016-06-08 (×2): 5000 [IU] via TOPICAL

## 2016-06-08 MED ORDER — SODIUM CHLORIDE 0.9 % IR SOLN
Status: DC | PRN
  Administered 2016-06-08: 08:00:00

## 2016-06-08 MED ORDER — PHENYLEPHRINE HCL 10 MG/ML IJ SOLN
INTRAMUSCULAR | Status: DC | PRN
  Administered 2016-06-08: 80 ug via INTRAVENOUS
  Administered 2016-06-08: 120 ug via INTRAVENOUS
  Administered 2016-06-08: 80 ug via INTRAVENOUS
  Administered 2016-06-08 (×2): 120 ug via INTRAVENOUS

## 2016-06-08 MED ORDER — FENTANYL CITRATE (PF) 100 MCG/2ML IJ SOLN
INTRAMUSCULAR | Status: DC | PRN
  Administered 2016-06-08: 100 ug via INTRAVENOUS
  Administered 2016-06-08 (×3): 50 ug via INTRAVENOUS

## 2016-06-08 MED ORDER — EPHEDRINE SULFATE 50 MG/ML IJ SOLN
INTRAMUSCULAR | Status: DC | PRN
  Administered 2016-06-08: 5 mg via INTRAVENOUS
  Administered 2016-06-08 (×2): 10 mg via INTRAVENOUS
  Administered 2016-06-08: 5 mg via INTRAVENOUS

## 2016-06-08 MED ORDER — BUPIVACAINE HCL (PF) 0.25 % IJ SOLN
INTRAMUSCULAR | Status: AC
  Filled 2016-06-08: qty 30

## 2016-06-08 MED ORDER — ACETAMINOPHEN 325 MG PO TABS
650.0000 mg | ORAL_TABLET | ORAL | Status: DC | PRN
  Administered 2016-06-09: 650 mg via ORAL
  Filled 2016-06-08: qty 2

## 2016-06-08 MED ORDER — ONDANSETRON HCL 4 MG/2ML IJ SOLN: 4.0000 mg | Freq: Once | INTRAMUSCULAR | Status: DC | PRN

## 2016-06-08 MED ORDER — SODIUM CHLORIDE 0.9 % IV SOLN: 250.0000 mL | INTRAVENOUS | Status: DC

## 2016-06-08 MED ORDER — PHENOL 1.4 % MT LIQD
1.0000 | OROMUCOSAL | Status: DC | PRN
  Administered 2016-06-08: 1 via OROMUCOSAL
  Filled 2016-06-08: qty 177

## 2016-06-08 SURGICAL SUPPLY — 49 items
APL SKNCLS STERI-STRIP NONHPOA (GAUZE/BANDAGES/DRESSINGS) ×1
BAG DECANTER FOR FLEXI CONT (MISCELLANEOUS) ×3 IMPLANT
BENZOIN TINCTURE PRP APPL 2/3 (GAUZE/BANDAGES/DRESSINGS) ×3 IMPLANT
BUR MATCHSTICK NEURO 3.0 LAGG (BURR) ×3 IMPLANT
CANISTER SUCT 3000ML PPV (MISCELLANEOUS) ×3 IMPLANT
CARTRIDGE OIL MAESTRO DRILL (MISCELLANEOUS) ×1 IMPLANT
CLOSURE WOUND 1/2 X4 (GAUZE/BANDAGES/DRESSINGS) ×1
DIFFUSER DRILL AIR PNEUMATIC (MISCELLANEOUS) ×3 IMPLANT
DRAPE LAPAROTOMY 100X72X124 (DRAPES) ×3 IMPLANT
DRAPE MICROSCOPE LEICA (MISCELLANEOUS) ×3 IMPLANT
DRAPE POUCH INSTRU U-SHP 10X18 (DRAPES) ×3 IMPLANT
DRAPE SURG 17X23 STRL (DRAPES) ×3 IMPLANT
DRSG OPSITE POSTOP 4X6 (GAUZE/BANDAGES/DRESSINGS) ×2 IMPLANT
DURAPREP 26ML APPLICATOR (WOUND CARE) ×3 IMPLANT
ELECT REM PT RETURN 9FT ADLT (ELECTROSURGICAL) ×3
ELECTRODE REM PT RTRN 9FT ADLT (ELECTROSURGICAL) ×1 IMPLANT
GAUZE SPONGE 4X4 16PLY XRAY LF (GAUZE/BANDAGES/DRESSINGS) IMPLANT
GLOVE BIO SURGEON STRL SZ7 (GLOVE) IMPLANT
GLOVE BIO SURGEON STRL SZ8 (GLOVE) ×1 IMPLANT
GLOVE BIOGEL PI IND STRL 7.0 (GLOVE) IMPLANT
GLOVE BIOGEL PI INDICATOR 7.0 (GLOVE)
GLOVE SURG SS PI 6.5 STRL IVOR (GLOVE) ×6 IMPLANT
GLOVE SURG SS PI 8.0 STRL IVOR (GLOVE) ×4 IMPLANT
GOWN STRL REUS W/ TWL LRG LVL3 (GOWN DISPOSABLE) IMPLANT
GOWN STRL REUS W/ TWL XL LVL3 (GOWN DISPOSABLE) ×1 IMPLANT
GOWN STRL REUS W/TWL 2XL LVL3 (GOWN DISPOSABLE) IMPLANT
GOWN STRL REUS W/TWL LRG LVL3 (GOWN DISPOSABLE) ×6
GOWN STRL REUS W/TWL XL LVL3 (GOWN DISPOSABLE) ×6
HEMOSTAT POWDER KIT SURGIFOAM (HEMOSTASIS) ×2 IMPLANT
KIT BASIN OR (CUSTOM PROCEDURE TRAY) ×3 IMPLANT
KIT ROOM TURNOVER OR (KITS) ×3 IMPLANT
NDL HYPO 25X1 1.5 SAFETY (NEEDLE) ×1 IMPLANT
NDL SPNL 20GX3.5 QUINCKE YW (NEEDLE) IMPLANT
NEEDLE HYPO 25X1 1.5 SAFETY (NEEDLE) ×3 IMPLANT
NEEDLE SPNL 20GX3.5 QUINCKE YW (NEEDLE) ×3 IMPLANT
NS IRRIG 1000ML POUR BTL (IV SOLUTION) ×3 IMPLANT
OIL CARTRIDGE MAESTRO DRILL (MISCELLANEOUS) ×3
PACK LAMINECTOMY NEURO (CUSTOM PROCEDURE TRAY) ×3 IMPLANT
PAD ARMBOARD 7.5X6 YLW CONV (MISCELLANEOUS) ×9 IMPLANT
RUBBERBAND STERILE (MISCELLANEOUS) ×6 IMPLANT
SPONGE SURGIFOAM ABS GEL SZ50 (HEMOSTASIS) ×3 IMPLANT
STRIP CLOSURE SKIN 1/2X4 (GAUZE/BANDAGES/DRESSINGS) ×2 IMPLANT
SUT VIC AB 0 CT1 18XCR BRD8 (SUTURE) ×1 IMPLANT
SUT VIC AB 0 CT1 8-18 (SUTURE) ×6
SUT VIC AB 2-0 CP2 18 (SUTURE) ×5 IMPLANT
SUT VIC AB 3-0 SH 8-18 (SUTURE) ×5 IMPLANT
TOWEL GREEN STERILE (TOWEL DISPOSABLE) ×3 IMPLANT
TOWEL GREEN STERILE FF (TOWEL DISPOSABLE) ×2 IMPLANT
WATER STERILE IRR 1000ML POUR (IV SOLUTION) ×3 IMPLANT

## 2016-06-08 NOTE — Op Note (Signed)
06/08/2016  11:19 AM  PATIENT:  Frank FallenKenneth A Robleto  76 y.o. 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 Venetia MaxonStern  ANESTHESIA:   General  EBL: 50 ml  Total I/O In: 2000 [I.V.:2000] Out: 50 [Blood:50]  BLOOD ADMINISTERED: none  DRAINS: None  SPECIMEN:  none  INDICATION FOR PROCEDURE: This patient presented with severe left leg pain. Imaging showed lumbar spinal stenosis. The patient tried conservative measures without relief. Pain was debilitating. Recommended decompressive hemilaminectomies L2-3 L3-4 and L4-5 on the left. Patient understood the risks, benefits, and alternatives and potential outcomes and wished to proceed.  PROCEDURE DETAILS: The patient was taken to the operating room and after induction of adequate generalized endotracheal anesthesia, the patient was rolled into the prone position on the Wilson frame and all pressure points were padded. The lumbar region was cleaned and then prepped with DuraPrep and draped in the usual sterile fashion. 5 cc of local anesthesia was injected and then a dorsal midline incision was made and carried down to the lumbo sacral fascia. The fascia was opened and the paraspinous musculature was taken down in a subperiosteal fashion to expose L2-3 L3-4 and L4-5 on the left. Intraoperative x-ray confirmed my level, and then I used a combination of the high-speed drill and the Kerrison punches to perform a hemilaminectomy, medial facetectomy, and foraminotomy at L2-3 L3-4 and L4-5 on the left. The underlying yellow ligament was opened and removed in a piecemeal fashion to expose the underlying dura and exiting nerve root. I undercut the lateral recess and dissected down until I was medial to and  distal to the pedicle at each level. The nerve root was well decompressed. We then gently retracted the nerve root medially with a retractor, coagulated the epidural venous vasculature, and inspected the disc space at L2-3. I found an inferior free fragment adjacent to the pedicle that was removed with a nerve hook and a pituitary rongeur. I then palpated with a coronary dilator along the nerve root and into the foramen to assure adequate decompression. I felt no more compression of the nerve root. I irrigated with saline solution containing bacitracin. Achieved hemostasis with bipolar cautery, lined the dura with Gelfoam, and then closed the fascia with 0 Vicryl. I closed the subcutaneous tissues with 2-0 Vicryl and the subcuticular tissues with 3-0 Vicryl. The skin was then closed with benzoin and Steri-Strips. The drapes were removed, a sterile dressing was applied. The patient was awakened from general anesthesia and transferred to the recovery room in stable condition. At the end of the procedure all sponge, needle and instrument counts were correct.    PLAN OF CARE: Admit for overnight observation  PATIENT DISPOSITION:  PACU - hemodynamically stable.   Delay start of Pharmacological VTE agent (>24hrs) due to surgical blood loss or risk of bleeding:  yes

## 2016-06-08 NOTE — Care Management Note (Addendum)
Case Management Note  Patient Details  Name: Dwana MelenaKenneth A Lawniczak MRN: [id number] Date of Birth: 15-Mar-1940  Subjective/Objective:  Pt admitted on 06/08/16 s/p Left L2-3 L3-4 and L4-5 hemilaminectomy and facetectomy and foraminotomy with left-sided discectomy L2-3.  PTA, pt resided at home with spouse.                     Action/Plan: Will follow for discharge planning as pt progresses.  Recommend PT/OT consults.    Expected Discharge Date:                  Expected Discharge Plan:  Home/self care In-House Referral:     Discharge planning Services  CM Consult  Post Acute Care Choice:    Choice offered to:     DME Arranged:    DME Agency:     HH Arranged:    HH Agency:     Status of Service:  Completed, will sign off If discussed at Long Length of Stay Meetings, dates discussed:    Additional Comments:  Glennon Macmerson, Christiane Sistare M, RN 06/08/2016, 5:18 PM

## 2016-06-08 NOTE — Anesthesia Preprocedure Evaluation (Signed)
Anesthesia Evaluation  Patient identified by MRN, date of birth, ID band Patient awake    Reviewed: Allergy & Precautions, NPO status , Patient's Chart, lab work & pertinent test results  Airway Mallampati: I  TM Distance: >3 FB Neck ROM: Full    Dental   Pulmonary former smoker,    Pulmonary exam normal       Cardiovascular hypertension, Pt. on medications Normal cardiovascular exam    Neuro/Psych    GI/Hepatic   Endo/Other  diabetes, Type 2, Oral Hypoglycemic Agents  Renal/GU      Musculoskeletal   Abdominal   Peds  Hematology   Anesthesia Other Findings   Reproductive/Obstetrics                             Anesthesia Physical Anesthesia Plan  ASA: II  Anesthesia Plan: General   Post-op Pain Management:    Induction: Intravenous  Airway Management Planned: Oral ETT  Additional Equipment:   Intra-op Plan:   Post-operative Plan: Extubation in OR  Informed Consent: I have reviewed the patients History and Physical, chart, labs and discussed the procedure including the risks, benefits and alternatives for the proposed anesthesia with the patient or authorized representative who has indicated his/her understanding and acceptance.     Plan Discussed with: CRNA and Surgeon  Anesthesia Plan Comments:         Anesthesia Quick Evaluation  

## 2016-06-08 NOTE — Anesthesia Postprocedure Evaluation (Signed)
Anesthesia Post Note  Patient: Frank Hill  Procedure(s) Performed: Procedure(s) (LRB): Laminectomy and Foraminotomy - Lumbar two -three - Lumbar three-four - Lumbar four-five , left (Left)  Patient location during evaluation: PACU Anesthesia Type: General Level of consciousness: awake and alert Pain management: pain level controlled Vital Signs Assessment: post-procedure vital signs reviewed and stable Respiratory status: spontaneous breathing, nonlabored ventilation, respiratory function stable and patient connected to nasal cannula oxygen Cardiovascular status: blood pressure returned to baseline and stable Postop Assessment: no signs of nausea or vomiting Anesthetic complications: no       Last Vitals:  Vitals:   06/08/16 1255 06/08/16 1310  BP: 109/83 (!) 106/56  Pulse: 89 83  Resp: 15 14  Temp:      Last Pain:  Vitals:   06/08/16 1320  TempSrc:   PainSc: Asleep                 Jenisse Vullo DAVID

## 2016-06-08 NOTE — Anesthesia Procedure Notes (Signed)
Procedure Name: Intubation Date/Time: 06/08/2016 8:54 AM Performed by: Lanell MatarBAKER, Roshawna Colclasure M Pre-anesthesia Checklist: Patient identified, Emergency Drugs available, Suction available and Patient being monitored Patient Re-evaluated:Patient Re-evaluated prior to inductionOxygen Delivery Method: Circle System Utilized Preoxygenation: Pre-oxygenation with 100% oxygen Intubation Type: IV induction Ventilation: Mask ventilation without difficulty Laryngoscope Size: Miller and 2 Grade View: Grade I Tube type: Oral Tube size: 7.5 mm Number of attempts: 1 Airway Equipment and Method: Stylet and Oral airway Placement Confirmation: ETT inserted through vocal cords under direct vision,  positive ETCO2 and breath sounds checked- equal and bilateral Secured at: 23 cm Tube secured with: Tape Dental Injury: Teeth and Oropharynx as per pre-operative assessment

## 2016-06-08 NOTE — Progress Notes (Signed)
Pharmacy Antibiotic Note Frank Hill is a 76 y.o. male admitted on 06/08/2016.  Pharmacy has been consulted for vancomycin dosing for surgical prophylaxis. No drains in place  Plan: Vancomycin 1000 mg IV x 1 tonight at 21:00 Will s/o please reconsult if needed      Temp (24hrs), Avg:97.4 F (36.3 C), Min:97 F (36.1 C), Max:97.8 F (36.6 C)  No results for input(s): WBC, CREATININE, LATICACIDVEN, VANCOTROUGH, VANCOPEAK, VANCORANDOM, GENTTROUGH, GENTPEAK, GENTRANDOM, TOBRATROUGH, TOBRAPEAK, TOBRARND, AMIKACINPEAK, AMIKACINTROU, AMIKACIN in the last 168 hours.  Estimated Creatinine Clearance: 72.5 mL/min (by C-G formula based on SCr of 0.98 mg/dL).    Allergies  Allergen Reactions  . Ciprofloxacin Other (See Comments)    MUSCLE WEAKNESS  . Adhesive [Tape] Rash  . Penicillins Rash     PATIENT HAD A PCN REACTION WITH IMMEDIATE RASH, FACIAL/TONGUE/THROAT SWELLING, SOB, OR LIGHTHEADEDNESS WITH HYPOTENSION:  #  #  #  YES  #  #  #   Has patient had a PCN reaction causing severe rash involving mucus membranes or skin necrosis: unknown Has patient had a PCN reaction that required hospitalization no Has patient had a PCN reaction occurring within the last 10 years: no If all of the above answers are "NO", then may proceed with Cephalosporin use.   . Prednisone Other (See Comments)    DEPRESSION    Frank NightingaleJames A Salaya Hill 06/08/2016 2:07 PM

## 2016-06-08 NOTE — H&P (Signed)
Subjective: Patient is a 76 y.o. male admitted for spinal stenosis with claudication. Onset of symptoms was several months ago, gradually worsening since that time.  The pain is rated severe, and is located at the across the lower back and radiates to LLE. The pain is described as aching and occurs all day. The symptoms have been progressive. Symptoms are exacerbated by exercise. MRI or CT showed stenosis   Past Medical History:  Diagnosis Date  . Arthritis    osteoarthritis- hips  . Bladder stones   . Diabetes mellitus without complication (HCC)    no current meds  . Dysrhythmia    atrial flutter  . Endocarditis 2-3 yrs ago   12/2015 cardiology notes indicate pericarditis (not endocarditis)   . History of kidney stones    multiple times  . Hx of seasonal allergies   . Hypertension   . Impaired hearing    no hearing aids  . Rhinitis   . Snores    snores loudly on back    Past Surgical History:  Procedure Laterality Date  . BLADDER STONE REMOVAL  yrs ago  . cystolithopaxy  yrs ago  . CYSTOSCOPY  yrs agoseveral done  . CYSTOSCOPY WITH RETROGRADE PYELOGRAM, URETEROSCOPY AND STENT PLACEMENT Left 08/15/2012   Procedure: CYSTOSCOPY LITHIOPEXY, URETEROSCOPY STONE EXTRACTION WITH HOLMIUM LASER AND STENT PLACEMENT;  Surgeon: Anner Crete, MD;  Location: [facility];  Service: Urology;  Laterality: Left;  . CYSTOSCOPY WITH RETROGRADE PYELOGRAM, URETEROSCOPY AND STENT PLACEMENT Right 09/02/2015   Procedure: CYSTOSCOPY WITH RIGHT RETROGRADE/URETEROSCOPY, CYSTOLITHOPAXY;  Surgeon: Bjorn Pippin, MD;  Location: [facility];  Service: Urology;  Laterality: Right;  . HOLMIUM LASER APPLICATION Left 08/15/2012   Procedure: HOLMIUM LASER APPLICATION;  Surgeon: Anner Crete, MD;  Location: [facility];  Service: Urology;  Laterality: Left;  . LITHOTRIPSY  15 yrs ago   several done  . TONSILLECTOMY  age 10  . TRANSURETHRAL RESECTION OF PROSTATE N/A 09/02/2015   Procedure: TRANSURETHRAL RESECTION OF THE PROSTATE (TURP) AND  PROSTATE BIOPSY;  Surgeon: Bjorn Pippin, MD;  Location: [facility];  Service: Urology;  Laterality: N/A;    Prior to Admission medications   Medication Sig Start Date End Date Taking? Authorizing Provider  acetaminophen (TYLENOL) 500 MG tablet Take 500-1,000 mg by mouth 3 (three) times daily as needed for mild pain.   Yes [provider]  AMINO ACIDS COMPLEX PO Take 1 tablet by mouth daily.    Yes [provider]  Ascorbic Acid (VITAMIN C) 1000 MG tablet Take 1,000 mg by mouth daily.    Yes [provider]  aspirin EC 81 MG tablet Take 81 mg by mouth daily.   Yes [provider]  chlorthalidone (HYGROTON) 25 MG tablet Take 25 mg by mouth daily. 06/05/16 06/05/17 Yes [provider]  Coenzyme Q10 (CO Q 10) 100 MG CAPS Take 1 capsule by mouth 2 (two) times daily.   Yes [provider]  doxazosin (CARDURA) 2 MG tablet Take 2 mg by mouth at bedtime. 06/05/16 06/05/17 Yes [provider]  fish oil-omega-3 fatty acids 1000 MG capsule Take 2 g by mouth 2 (two) times daily.   Yes [provider]  fluticasone (FLONASE) 50 MCG/ACT nasal spray Place 2 sprays into the nose daily as needed for allergies.    Yes [provider]  Garlic 200 MG TABS Take 400 mg by mouth 2 (two) times daily.   Yes [provider]  Ginkgo Biloba 120 MG TABS Take  120 mg by mouth daily.   Yes [provider]  Glucosamine Sulfate 1000 MG CAPS Take 1,000 mg by mouth 2 (two) times daily.   Yes [provider]  Hyaluronic Acid-Vitamin C (HYALURONIC ACID PO) Take 1 tablet by mouth daily.   Yes [provider]  LECITHIN PO Take 1 tablet by mouth 2 (two) times daily.   Yes [provider]  lisinopril (PRINIVIL,ZESTRIL) 20 MG tablet Take 20 mg by mouth 2 (two) times daily.   Yes [provider]  metFORMIN (GLUCOPHAGE) 1000 MG tablet Take 500 mg by mouth 2 (two) times daily. 03/15/16  Yes [provider]   Methylsulfonylmethane (MSM) 1000 MG TABS Take 1 tablet by mouth daily.    Yes [provider]  Multiple Vitamin (MULTIVITAMIN WITH MINERALS) TABS Take 1 tablet by mouth 2 (two) times daily.    Yes [provider]  naproxen sodium (ALEVE) 220 MG tablet Take 110-220 mg by mouth daily as needed (pain).   Yes [provider]  niacin (SLO-NIACIN) 500 MG tablet Take 500 mg by mouth at bedtime.   Yes [provider]  Psyllium (METAMUCIL FIBER PO) Take 1 each by mouth at bedtime. 2 tablespoons mixes up with cinnamon, vinegar, lemon juice   Yes [provider]  ranitidine (ZANTAC) 300 MG tablet Take 300 mg by mouth 2 (two) times daily. 04/02/16  Yes [provider]  sildenafil (VIAGRA) 50 MG tablet Take 50 mg by mouth daily as needed for erectile dysfunction.   Yes [provider]  simvastatin (ZOCOR) 40 MG tablet Take 20 mg by mouth at bedtime. 04/20/16  Yes [provider]  sotalol (BETAPACE) 120 MG tablet Take 120 mg by mouth 2 (two) times daily.   Yes [provider]  tetrahydrozoline-zinc (VISINE-AC) 0.05-0.25 % ophthalmic solution Place 2 drops into both eyes 3 (three) times daily as needed (dry eyes).   Yes [provider]  VANADYL SULFATE PO Take 1 tablet by mouth daily.    Yes [provider]  vitamin B-12 (CYANOCOBALAMIN) 1000 MCG tablet Take 1,000 mcg by mouth daily.   Yes [provider]  Calcium Carbonate-Simethicone (ROLAIDS MULTI-SYMPTOM PO) Take 2 tablets by mouth every 6 (six) hours as needed (heartburn).     [provider]   Allergies  Allergen Reactions  . Ciprofloxacin Other (See Comments)    MUSCLE WEAKNESS  . Adhesive [Tape] Rash  . Penicillins Rash     PATIENT HAD A PCN REACTION WITH IMMEDIATE RASH, FACIAL/TONGUE/THROAT SWELLING, SOB, OR LIGHTHEADEDNESS WITH HYPOTENSION:  #  #  #  YES  #  #  #   Has patient had a PCN reaction causing severe rash involving mucus  membranes or skin necrosis: unknown Has patient had a PCN reaction that required hospitalization no Has patient had a PCN reaction occurring within the last 10 years: no If all of the above answers are "NO", then may proceed with Cephalosporin use.   . Prednisone Other (See Comments)    DEPRESSION    Social History  Substance Use Topics  . Smoking status: Former Smoker    Packs/day: 1.00    Years: 15.00    Types: Cigarettes    Quit date: 01/30/1978  . Smokeless tobacco: Current User    Types: Chew, Snuff     Comment: current use of smokeless tobacco  . Alcohol use No    History reviewed. No pertinent family history.   Review of Systems  Positive ROS:  neg  All other systems have been reviewed and were otherwise negative with the exception of those mentioned in the HPI and as above.  Objective: Vital signs in last 24 hours: Temp:  [97.8 F (36.6 C)] 97.8 F (36.6 C) (05/10 0654) Pulse Rate:  [66] 66 (05/10 0654) Resp:  [20] 20 (05/10 0654) BP: (118)/(73) 118/73 (05/10 0654) SpO2:  [100 %] 100 % (05/10 0654)  General Appearance: Alert, cooperative, no distress, appears stated age Head: Normocephalic, without obvious abnormality, atraumatic Eyes: PERRL, conjunctiva/corneas clear, EOM's intact    Neck: Supple, symmetrical, trachea midline Back: Symmetric, no curvature, ROM normal, no CVA tenderness Lungs:  respirations unlabored Heart: Regular rate and rhythm Abdomen: Soft, non-tender Extremities: Extremities normal, atraumatic, no cyanosis or edema Pulses: 2+ and symmetric all extremities Skin: Skin color, texture, turgor normal, no rashes or lesions  NEUROLOGIC:   Mental status: Alert and oriented x4,  no aphasia, good attention span, fund of knowledge, and memory Motor Exam - grossly normal Sensory Exam - grossly normal Reflexes: 1+ Coordination - grossly normal Gait - grossly normal Balance - grossly normal Cranial Nerves: I: smell Not tested  II: visual  acuity  OS: nl    OD: nl  II: visual fields Full to confrontation  II: pupils Equal, round, reactive to light  III,VII: ptosis None  III,IV,VI: extraocular muscles  Full ROM  V: mastication Normal  V: facial light touch sensation  Normal  V,VII: corneal reflex  Present  VII: facial muscle function - upper  Normal  VII: facial muscle function - lower Normal  VIII: hearing Not tested  IX: soft palate elevation  Normal  IX,X: gag reflex Present  XI: trapezius strength  5/5  XI: sternocleidomastoid strength 5/5  XI: neck flexion strength  5/5  XII: tongue strength  Normal    Data Review Lab Results  Component Value Date   WBC 7.0 05/29/2016   HGB 12.9 (L) 05/29/2016   HCT 37.7 (L) 05/29/2016   MCV 90.4 05/29/2016   PLT 199 05/29/2016   Lab Results  Component Value Date   NA 137 05/29/2016   K 4.5 05/29/2016   CL 105 05/29/2016   CO2 23 05/29/2016   BUN 14 05/29/2016   CREATININE 0.98 05/29/2016   GLUCOSE 129 (H) 05/29/2016   Lab Results  Component Value Date   INR 1.02 05/29/2016    Assessment/Plan: 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 Patient has failed a reasonable attempt at conservative therapy.  I explained the condition and procedure to the patient and answered any questions.  Patient wishes to proceed with procedure as planned. Understands risks/ benefits and typical outcomes of procedure.   Tekelia Kareem S 06/08/2016 8:34 AM

## 2016-06-08 NOTE — Transfer of Care (Signed)
Immediate Anesthesia Transfer of Care Note  Patient: Frank Hill  Procedure(s) Performed: Procedure(s): Laminectomy and Foraminotomy - Lumbar two -three - Lumbar three-four - Lumbar four-five , left (Left)  Patient Location: PACU  Anesthesia Type:General  Level of Consciousness: awake, alert  and oriented  Airway & Oxygen Therapy: Patient Spontanous Breathing and Patient connected to nasal cannula oxygen  Post-op Assessment: Report given to RN, Post -op Vital signs reviewed and stable and Patient moving all extremities X 4  Post vital signs: Reviewed and stable  Last Vitals:  Vitals:   06/08/16 0654  BP: 118/73  Pulse: 66  Resp: 20  Temp: 36.6 C    Last Pain:  Vitals:   06/08/16 0739  TempSrc:   PainSc: 4       Patients Stated Pain Goal: 3 (06/08/16 0739)  Complications: No apparent anesthesia complications

## 2016-06-09 ENCOUNTER — Encounter (HOSPITAL_COMMUNITY): Payer: Self-pay | Admitting: Neurological Surgery

## 2016-06-09 DIAGNOSIS — M48061 Spinal stenosis, lumbar region without neurogenic claudication: Secondary | ICD-10-CM | POA: Diagnosis not present

## 2016-06-09 LAB — GLUCOSE, CAPILLARY: Glucose-Capillary: 157 mg/dL — ABNORMAL HIGH (ref 65–99)

## 2016-06-09 LAB — TROPONIN I: Troponin I: <0.03 ng/mL (ref <0.03)

## 2016-06-09 NOTE — Care Management Obs Status (Signed)
MEDICARE OBSERVATION STATUS NOTIFICATION   Patient Details  Name: Frank Hill MRN: [id number] Date of Birth: 1940/07/04   Medicare Observation Status Notification Given:  Yes    Frank Hill, Frank Scialdone M, RN 06/09/2016, 11:52 AM

## 2016-06-09 NOTE — Care Management Note (Signed)
Case Management Note  Patient Details  Name: Frank MelenaKenneth A Ekman MRN: [id number] Date of Birth: 1940/09/10  Subjective/Objective:  Pt admitted on 06/08/16 s/p Left L2-3 L3-4 and L4-5 hemilaminectomy and facetectomy and foraminotomy with left-sided discectomy L2-3.  PTA, pt resided at home with spouse.                     Action/Plan: Will follow for discharge planning as pt progresses.  Recommend PT/OT consults.    Expected Discharge Date:  06/09/16               Expected Discharge Plan:  Home/self care In-House Referral:     Discharge planning Services  CM Consult  Post Acute Care Choice:    Choice offered to:     DME Arranged:    DME Agency:     HH Arranged:    HH Agency:     Status of Service:  Completed, will sign off If discussed at Long Length of Stay Meetings, dates discussed:    Additional Comments:  06/09/16 J. Urias Sheek, RN, BSN Pt stable for discharge home with spouse.  Pt ambulating well without assistive device.  No dc needs identified.   Glennon MacAmerson, Jaynell Castagnola M, RN 06/09/2016, 4:09 PM

## 2016-06-09 NOTE — Discharge Summary (Signed)
Physician Discharge Summary  Patient ID: Frank Hill MRN: [id number] DOB/AGE: May 07, 1940 76 y.o.  Admit date: 06/08/2016 Discharge date: 06/09/2016  Admission Diagnoses: spinal stenosis/ lumbar    Discharge Diagnoses: same   Discharged Condition: good  Hospital Course: The patient was admitted on 06/08/2016 and taken to the operating room where the patient underwent LL for stenosis. The patient tolerated the procedure well and was taken to the recovery room and then to the ICU in stable condition because he had some issues with ow BP post op requiring a short duration of neo. Enzymes neg.The hospital course was routine. There were no complications. The wound remained clean dry and intact. Pt had appropriate back soreness. No complaints of leg pain or new N/T/W. The patient remained afebrile with stable vital signs, and tolerated a regular diet. The patient continued to increase activities, and pain was well controlled with oral pain medications.   Consults: None  Significant Diagnostic Studies:  Results for orders placed or performed during the hospital encounter of 06/08/16  Glucose, capillary  Result Value Ref Range   Glucose-Capillary 138 (H) 65 - 99 mg/dL  Glucose, capillary  Result Value Ref Range   Glucose-Capillary 148 (H) 65 - 99 mg/dL   Comment 1 Notify RN    Comment 2 Document in Chart   Troponin I (q 6hr x 3)  Result Value Ref Range   Troponin I <0.03 <0.03 ng/mL  Troponin I (q 6hr x 3)  Result Value Ref Range   Troponin I <0.03 <0.03 ng/mL  Troponin I (q 6hr x 3)  Result Value Ref Range   Troponin I <0.03 <0.03 ng/mL  Glucose, capillary  Result Value Ref Range   Glucose-Capillary 186 (H) 65 - 99 mg/dL  Glucose, capillary  Result Value Ref Range   Glucose-Capillary 130 (H) 65 - 99 mg/dL    Chest 2 View  Result Date: 05/31/2016 CLINICAL DATA:  Preop for lumbar spine surgery EXAM: CHEST  2 VIEW COMPARISON:  08/14/2012 FINDINGS: Hyperinflation with chronic  blunting of the lateral right costophrenic sulcus attributed to pleural based scarring. 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 Likely normal heart size when accounting for fat pad around the apex seen on 2017 abdominal CT. There is no edema, consolidation, effusion, or pneumothorax. 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 IMPRESSION: 1. No evidence of active disease. 2. 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 3. Osteophytes with diffuse thoracic ankylosis Electronically Signed   By: Marnee SpringJonathon  Watts M.D.   On: 05/31/2016 14:53   Dg Lumbar Spine 1 View  Result Date: 06/08/2016 CLINICAL DATA:  Intraoperative localization for spine surgery. EXAM: LUMBAR SPINE - 1 VIEW COMPARISON:  MRI of lumbar spine 03/11/2016 FINDINGS: There is a surgical retractor at the L4 level and a surgical instrument which is marking the L3-4 disc space level. IMPRESSION: L3-4 marked intraoperatively. Electronically Signed   By: Rudie MeyerP.  Gallerani M.D.   On: 06/08/2016 11:40    Antibiotics:  Anti-infectives    Start     Dose/Rate Route Frequency Ordered Stop   06/08/16 2100  vancomycin (VANCOCIN) IVPB 1000 mg/200 mL premix     1,000 mg 200 mL/hr over 60 Minutes Intravenous  Once 06/08/16 1407     06/08/16 0810  bacitracin 50,000 Units in sodium chloride irrigation 0.9 % 500 mL irrigation  Status:  Discontinued       As needed 06/08/16 0953 06/08/16 1101   06/08/16 0716  vancomycin (VANCOCIN) IVPB 1000 mg/200 mL premix     1,000 mg  200 mL/hr over 60 Minutes Intravenous On call to O.R. 06/08/16 0716 06/08/16 1010      Discharge Exam: Blood pressure 107/66, pulse 99, temperature 99.6 F (37.6 C), temperature source Oral, resp. rate 17, SpO2 97 %. Neurologic: Grossly normal Dressing dry  Discharge Medications:   Allergies as of 06/09/2016      Reactions   Ciprofloxacin Other (See Comments)   MUSCLE WEAKNESS   Adhesive [tape] Rash   Penicillins Rash   PATIENT HAD A PCN REACTION WITH IMMEDIATE RASH,  FACIAL/TONGUE/THROAT SWELLING, SOB, OR LIGHTHEADEDNESS WITH HYPOTENSION:  #  #  #  YES  #  #  #   Has patient had a PCN reaction causing severe rash involving mucus membranes or skin necrosis: unknown Has patient had a PCN reaction that required hospitalization no Has patient had a PCN reaction occurring within the last 10 years: no If all of the above answers are "NO", then may proceed with Cephalosporin use.   Prednisone Other (See Comments)   DEPRESSION      Medication List    TAKE these medications   acetaminophen 500 MG tablet Commonly known as:  TYLENOL Take 500-1,000 mg by mouth 3 (three) times daily as needed for mild pain.   ALEVE 220 MG tablet Generic drug:  naproxen sodium Take 110-220 mg by mouth daily as needed (pain).   AMINO ACIDS COMPLEX PO Take 1 tablet by mouth daily.   aspirin EC 81 MG tablet Take 81 mg by mouth daily.   chlorthalidone 25 MG tablet Commonly known as:  HYGROTON Take 25 mg by mouth daily.   Co Q 10 100 MG Caps Take 1 capsule by mouth 2 (two) times daily.   doxazosin 2 MG tablet Commonly known as:  CARDURA Take 2 mg by mouth at bedtime.   fish oil-omega-3 fatty acids 1000 MG capsule Take 2 g by mouth 2 (two) times daily.   fluticasone 50 MCG/ACT nasal spray Commonly known as:  FLONASE Place 2 sprays into the nose daily as needed for allergies.   Garlic 200 MG Tabs Take 400 mg by mouth 2 (two) times daily.   Ginkgo Biloba 120 MG Tabs Take 120 mg by mouth daily.   Glucosamine Sulfate 1000 MG Caps Take 1,000 mg by mouth 2 (two) times daily.   HYALURONIC ACID PO Take 1 tablet by mouth daily.   LECITHIN PO Take 1 tablet by mouth 2 (two) times daily.   lisinopril 20 MG tablet Commonly known as:  PRINIVIL,ZESTRIL Take 20 mg by mouth 2 (two) times daily.   METAMUCIL FIBER PO Take 1 each by mouth at bedtime. 2 tablespoons mixes up with cinnamon, vinegar, lemon juice   metFORMIN 1000 MG tablet Commonly known as:   GLUCOPHAGE Take 500 mg by mouth 2 (two) times daily.   MSM 1000 MG Tabs Take 1 tablet by mouth daily.   multivitamin with minerals Tabs tablet Take 1 tablet by mouth 2 (two) times daily.   niacin 500 MG tablet Commonly known as:  SLO-NIACIN Take 500 mg by mouth at bedtime.   ranitidine 300 MG tablet Commonly known as:  ZANTAC Take 300 mg by mouth 2 (two) times daily.   ROLAIDS MULTI-SYMPTOM PO Take 2 tablets by mouth every 6 (six) hours as needed (heartburn).   sildenafil 50 MG tablet Commonly known as:  VIAGRA Take 50 mg by mouth daily as needed for erectile dysfunction.   simvastatin 40 MG tablet Commonly known as:  ZOCOR Take 20 mg  by mouth at bedtime.   sotalol 120 MG tablet Commonly known as:  BETAPACE Take 120 mg by mouth 2 (two) times daily.   tetrahydrozoline-zinc 0.05-0.25 % ophthalmic solution Commonly known as:  VISINE-AC Place 2 drops into both eyes 3 (three) times daily as needed (dry eyes).   VANADYL SULFATE PO Take 1 tablet by mouth daily.   vitamin B-12 1000 MCG tablet Commonly known as:  CYANOCOBALAMIN Take 1,000 mcg by mouth daily.   vitamin C 1000 MG tablet Take 1,000 mg by mouth daily.       Disposition: home   Final Dx: LL for stenosis  Discharge Instructions     Remove dressing in 72 hours    Complete by:  As directed    Call MD for:  difficulty breathing, headache or visual disturbances    Complete by:  As directed    Call MD for:  persistant nausea and vomiting    Complete by:  As directed    Call MD for:  redness, tenderness, or signs of infection (pain, swelling, redness, odor or green/yellow discharge around incision site)    Complete by:  As directed    Call MD for:  severe uncontrolled pain    Complete by:  As directed    Call MD for:  temperature >100.4    Complete by:  As directed    Diet - low sodium heart healthy    Complete by:  As directed    Increase activity slowly    Complete by:  As directed        Follow-up Information    Tia Alert, MD. Schedule an appointment as soon as possible for a visit in 2 week(s).   Specialty:  Neurosurgery Contact information: [address] [place] [address] [phone]            Signed: Tia Alert 06/09/2016, 7:43 AM

## 2016-06-21 DIAGNOSIS — R351 Nocturia: Secondary | ICD-10-CM | POA: Diagnosis not present

## 2016-06-21 DIAGNOSIS — Z87442 Personal history of urinary calculi: Secondary | ICD-10-CM | POA: Diagnosis not present

## 2016-06-21 DIAGNOSIS — N401 Enlarged prostate with lower urinary tract symptoms: Secondary | ICD-10-CM | POA: Diagnosis not present

## 2016-09-05 DIAGNOSIS — N170 Acute kidney failure with tubular necrosis: Secondary | ICD-10-CM | POA: Diagnosis not present

## 2016-09-05 DIAGNOSIS — N179 Acute kidney failure, unspecified: Secondary | ICD-10-CM | POA: Diagnosis not present

## 2016-09-05 DIAGNOSIS — N186 End stage renal disease: Secondary | ICD-10-CM | POA: Diagnosis not present

## 2016-09-05 DIAGNOSIS — A020 Salmonella enteritis: Secondary | ICD-10-CM | POA: Diagnosis not present

## 2016-09-05 DIAGNOSIS — K529 Noninfective gastroenteritis and colitis, unspecified: Secondary | ICD-10-CM | POA: Diagnosis not present

## 2016-09-05 DIAGNOSIS — I493 Ventricular premature depolarization: Secondary | ICD-10-CM | POA: Diagnosis not present

## 2016-09-05 DIAGNOSIS — I959 Hypotension, unspecified: Secondary | ICD-10-CM | POA: Diagnosis not present

## 2016-09-05 DIAGNOSIS — E7800 Pure hypercholesterolemia, unspecified: Secondary | ICD-10-CM | POA: Diagnosis not present

## 2016-09-05 DIAGNOSIS — Z7984 Long term (current) use of oral hypoglycemic drugs: Secondary | ICD-10-CM | POA: Diagnosis not present

## 2016-09-05 DIAGNOSIS — E118 Type 2 diabetes mellitus with unspecified complications: Secondary | ICD-10-CM | POA: Diagnosis not present

## 2016-09-05 DIAGNOSIS — I10 Essential (primary) hypertension: Secondary | ICD-10-CM | POA: Diagnosis not present

## 2016-09-05 DIAGNOSIS — F17290 Nicotine dependence, other tobacco product, uncomplicated: Secondary | ICD-10-CM | POA: Diagnosis not present

## 2016-09-05 DIAGNOSIS — A021 Salmonella sepsis: Secondary | ICD-10-CM | POA: Diagnosis not present

## 2016-09-05 DIAGNOSIS — E871 Hypo-osmolality and hyponatremia: Secondary | ICD-10-CM | POA: Diagnosis not present

## 2016-09-05 DIAGNOSIS — E876 Hypokalemia: Secondary | ICD-10-CM | POA: Diagnosis not present

## 2016-09-05 DIAGNOSIS — E119 Type 2 diabetes mellitus without complications: Secondary | ICD-10-CM | POA: Insufficient documentation

## 2016-09-05 DIAGNOSIS — Z880 Allergy status to penicillin: Secondary | ICD-10-CM | POA: Diagnosis not present

## 2016-09-05 DIAGNOSIS — R197 Diarrhea, unspecified: Secondary | ICD-10-CM | POA: Diagnosis not present

## 2016-09-05 DIAGNOSIS — Z7982 Long term (current) use of aspirin: Secondary | ICD-10-CM | POA: Diagnosis not present

## 2016-09-05 DIAGNOSIS — E860 Dehydration: Secondary | ICD-10-CM | POA: Diagnosis not present

## 2016-09-05 DIAGNOSIS — I4891 Unspecified atrial fibrillation: Secondary | ICD-10-CM | POA: Diagnosis not present

## 2016-09-05 DIAGNOSIS — I4892 Unspecified atrial flutter: Secondary | ICD-10-CM | POA: Diagnosis not present

## 2016-09-05 DIAGNOSIS — E872 Acidosis: Secondary | ICD-10-CM | POA: Diagnosis not present

## 2016-09-08 DIAGNOSIS — A020 Salmonella enteritis: Secondary | ICD-10-CM | POA: Insufficient documentation

## 2016-09-20 DIAGNOSIS — E119 Type 2 diabetes mellitus without complications: Secondary | ICD-10-CM | POA: Diagnosis not present

## 2016-09-20 DIAGNOSIS — E860 Dehydration: Secondary | ICD-10-CM | POA: Diagnosis not present

## 2016-09-20 DIAGNOSIS — R5383 Other fatigue: Secondary | ICD-10-CM | POA: Diagnosis not present

## 2016-09-20 DIAGNOSIS — N189 Chronic kidney disease, unspecified: Secondary | ICD-10-CM | POA: Diagnosis not present

## 2016-09-21 DIAGNOSIS — E119 Type 2 diabetes mellitus without complications: Secondary | ICD-10-CM | POA: Diagnosis not present

## 2016-09-21 DIAGNOSIS — R5383 Other fatigue: Secondary | ICD-10-CM | POA: Diagnosis not present

## 2016-10-09 DIAGNOSIS — N189 Chronic kidney disease, unspecified: Secondary | ICD-10-CM | POA: Diagnosis not present

## 2016-10-09 DIAGNOSIS — E119 Type 2 diabetes mellitus without complications: Secondary | ICD-10-CM | POA: Diagnosis not present

## 2016-10-09 DIAGNOSIS — F430 Acute stress reaction: Secondary | ICD-10-CM | POA: Diagnosis not present

## 2016-10-09 DIAGNOSIS — I10 Essential (primary) hypertension: Secondary | ICD-10-CM | POA: Diagnosis not present

## 2016-12-28 DIAGNOSIS — E7800 Pure hypercholesterolemia, unspecified: Secondary | ICD-10-CM | POA: Diagnosis not present

## 2016-12-28 DIAGNOSIS — I10 Essential (primary) hypertension: Secondary | ICD-10-CM | POA: Diagnosis not present

## 2016-12-28 DIAGNOSIS — I4892 Unspecified atrial flutter: Secondary | ICD-10-CM | POA: Diagnosis not present

## 2016-12-28 DIAGNOSIS — Z87891 Personal history of nicotine dependence: Secondary | ICD-10-CM | POA: Diagnosis not present

## 2017-02-20 DIAGNOSIS — I10 Essential (primary) hypertension: Secondary | ICD-10-CM | POA: Diagnosis not present

## 2017-02-20 DIAGNOSIS — Z6829 Body mass index (BMI) 29.0-29.9, adult: Secondary | ICD-10-CM | POA: Diagnosis not present

## 2017-02-20 DIAGNOSIS — M48062 Spinal stenosis, lumbar region with neurogenic claudication: Secondary | ICD-10-CM | POA: Diagnosis not present

## 2017-02-27 DIAGNOSIS — M48062 Spinal stenosis, lumbar region with neurogenic claudication: Secondary | ICD-10-CM | POA: Diagnosis not present

## 2017-02-28 DIAGNOSIS — M48062 Spinal stenosis, lumbar region with neurogenic claudication: Secondary | ICD-10-CM | POA: Diagnosis not present

## 2017-02-28 DIAGNOSIS — M48061 Spinal stenosis, lumbar region without neurogenic claudication: Secondary | ICD-10-CM | POA: Diagnosis not present

## 2017-03-12 ENCOUNTER — Emergency Department (HOSPITAL_COMMUNITY): Payer: PPO

## 2017-03-12 ENCOUNTER — Other Ambulatory Visit: Payer: Self-pay

## 2017-03-12 ENCOUNTER — Encounter (HOSPITAL_COMMUNITY): Payer: Self-pay | Admitting: *Deleted

## 2017-03-12 ENCOUNTER — Emergency Department (HOSPITAL_COMMUNITY)
Admission: EM | Admit: 2017-03-12 | Discharge: 2017-03-12 | Disposition: A | Discharge status: Home / Self Care | Payer: PPO | Attending: Emergency Medicine | Admitting: Emergency Medicine

## 2017-03-12 DIAGNOSIS — M545 Low back pain: Secondary | ICD-10-CM | POA: Diagnosis not present

## 2017-03-12 DIAGNOSIS — I10 Essential (primary) hypertension: Secondary | ICD-10-CM | POA: Diagnosis not present

## 2017-03-12 DIAGNOSIS — Z7982 Long term (current) use of aspirin: Secondary | ICD-10-CM | POA: Insufficient documentation

## 2017-03-12 DIAGNOSIS — E119 Type 2 diabetes mellitus without complications: Secondary | ICD-10-CM | POA: Diagnosis not present

## 2017-03-12 DIAGNOSIS — M5416 Radiculopathy, lumbar region: Secondary | ICD-10-CM | POA: Diagnosis not present

## 2017-03-12 DIAGNOSIS — Z87891 Personal history of nicotine dependence: Secondary | ICD-10-CM | POA: Insufficient documentation

## 2017-03-12 DIAGNOSIS — Z79899 Other long term (current) drug therapy: Secondary | ICD-10-CM | POA: Insufficient documentation

## 2017-03-12 DIAGNOSIS — Z7984 Long term (current) use of oral hypoglycemic drugs: Secondary | ICD-10-CM | POA: Diagnosis not present

## 2017-03-12 DIAGNOSIS — M25552 Pain in left hip: Secondary | ICD-10-CM | POA: Diagnosis not present

## 2017-03-12 LAB — BASIC METABOLIC PANEL
Anion gap: 10 (ref 5–15)
BUN: 17 mg/dL (ref 6–20)
CO2: 26 mmol/L (ref 22–32)
CREATININE: 1.06 mg/dL (ref 0.61–1.24)
Calcium: 9.3 mg/dL (ref 8.9–10.3)
Chloride: 105 mmol/L (ref 101–111)
GFR calc Af Amer: >60 mL/min (ref >60)
GFR calc non Af Amer: >60 mL/min (ref >60)
GLUCOSE: 148 mg/dL — AB (ref 65–99)
Potassium: 4 mmol/L (ref 3.5–5.1)
SODIUM: 141 mmol/L (ref 135–145)

## 2017-03-12 LAB — CBC WITH DIFFERENTIAL/PLATELET
Basophils Absolute: 0 10*3/uL (ref 0.0–0.1)
Basophils Relative: 0 %
EOS ABS: 0.2 10*3/uL (ref 0.0–0.7)
EOS PCT: 2 %
HCT: 40 % (ref 39.0–52.0)
Hemoglobin: 13.5 g/dL (ref 13.0–17.0)
LYMPHS ABS: 1.2 10*3/uL (ref 0.7–4.0)
Lymphocytes Relative: 18 %
MCH: 30.9 pg (ref 26.0–34.0)
MCHC: 33.8 g/dL (ref 30.0–36.0)
MCV: 91.5 fL (ref 78.0–100.0)
MONOS PCT: 6 %
Monocytes Absolute: 0.4 10*3/uL (ref 0.1–1.0)
Neutro Abs: 4.7 10*3/uL (ref 1.7–7.7)
Neutrophils Relative %: 74 %
PLATELETS: 169 10*3/uL (ref 150–400)
RBC: 4.37 MIL/uL (ref 4.22–5.81)
RDW: 13.5 % (ref 11.5–15.5)
WBC: 6.5 10*3/uL (ref 4.0–10.5)

## 2017-03-12 MED ORDER — OXYCODONE-ACETAMINOPHEN 5-325 MG PO TABS
2.0000 | ORAL_TABLET | Freq: Once | ORAL | Status: AC
  Administered 2017-03-12: 2 via ORAL
  Filled 2017-03-12: qty 2

## 2017-03-12 MED ORDER — CLONIDINE HCL 0.1 MG PO TABS
0.1000 mg | ORAL_TABLET | Freq: Once | ORAL | Status: AC
  Administered 2017-03-12: 0.1 mg via ORAL
  Filled 2017-03-12: qty 1

## 2017-03-12 MED ORDER — LISINOPRIL 20 MG PO TABS
20.0000 mg | ORAL_TABLET | Freq: Once | ORAL | Status: AC
  Administered 2017-03-12: 20 mg via ORAL
  Filled 2017-03-12: qty 1

## 2017-03-12 MED ORDER — HYDROMORPHONE HCL 1 MG/ML IJ SOLN
1.0000 mg | Freq: Once | INTRAMUSCULAR | Status: AC
  Administered 2017-03-12: 1 mg via INTRAMUSCULAR
  Filled 2017-03-12: qty 1

## 2017-03-12 MED ORDER — LISINOPRIL 10 MG PO TABS: 10.0000 mg | ORAL_TABLET | Freq: Once | ORAL | Status: DC

## 2017-03-12 NOTE — ED Notes (Signed)
Patient transported to X-ray 

## 2017-03-12 NOTE — ED Provider Notes (Signed)
[facility]ENC[facility] Provider Note   CSN: [id number] Arrival date & time: 03/12/17  [id number]     History   Chief Complaint Chief Complaint  Patient presents with  . Back Pain    HPI Frank Hill is a 77 y.o. male.  The history is provided by the patient. No language interpreter was used.  Back Pain   This is a new problem. The current episode started more than 1 week ago. The problem occurs constantly. The problem has been gradually worsening. The pain is associated with no known injury. The pain is present in the lumbar spine. The pain is moderate. The pain is the same all the time. He has tried nothing for the symptoms. The treatment provided no relief.  Pt was at Dr. Yetta BarreJones office to get a steroid injection due to back pain.  Pt reports they could not do injection due to his high blood pressure.  Pt reports he is not taking lisinopril.  Pt complains of severe pain   Past Medical History:  Diagnosis Date  . Arthritis    osteoarthritis- hips  . Bladder stones   . Diabetes mellitus without complication (HCC)    no current meds  . Dysrhythmia    atrial flutter  . Endocarditis 2-3 yrs ago   12/2015 cardiology notes indicate pericarditis (not endocarditis)   . History of kidney stones    multiple times  . Hx of seasonal allergies   . Hypertension   . Impaired hearing    no hearing aids  . Rhinitis   . Snores    snores loudly on back    Patient Active Problem List   Diagnosis Date Noted  . S/P lumbar laminectomy 06/08/2016  . Elevated PSA 09/03/2015  . Bladder calculi 09/03/2015  . Ureteral calculus, right 09/03/2015  . BPH (benign prostatic hypertrophy) with urinary obstruction 09/02/2015    Past Surgical History:  Procedure Laterality Date  . BLADDER STONE REMOVAL  yrs ago  . cystolithopaxy  yrs ago  . CYSTOSCOPY  yrs agoseveral done  . CYSTOSCOPY WITH RETROGRADE PYELOGRAM, URETEROSCOPY AND STENT PLACEMENT Left 08/15/2012   Procedure: CYSTOSCOPY LITHIOPEXY, URETEROSCOPY STONE EXTRACTION WITH HOLMIUM LASER AND STENT PLACEMENT;  Surgeon: Anner CreteJohn J Wrenn, MD;  Location: [facility];  Service: Urology;  Laterality: Left;  . CYSTOSCOPY WITH RETROGRADE PYELOGRAM, URETEROSCOPY AND STENT PLACEMENT Right 09/02/2015   Procedure: CYSTOSCOPY WITH RIGHT RETROGRADE/URETEROSCOPY, CYSTOLITHOPAXY;  Surgeon: Bjorn PippinJohn Wrenn, MD;  Location: [facility];  Service: Urology;  Laterality: Right;  . HOLMIUM LASER APPLICATION Left 08/15/2012   Procedure: HOLMIUM LASER APPLICATION;  Surgeon: Anner CreteJohn J Wrenn, MD;  Location: [facility];  Service: Urology;  Laterality: Left;  . LITHOTRIPSY  15 yrs ago   several done  . LUMBAR LAMINECTOMY/DECOMPRESSION MICRODISCECTOMY Left 06/08/2016   Procedure: Laminectomy and Foraminotomy - Lumbar two -three - Lumbar three-four - Lumbar four-five , left;  Surgeon: Tia AlertJones, David S, MD;  [facility];  Service: Neurosurgery;  Laterality: Left;  . TONSILLECTOMY  age 89  . TRANSURETHRAL RESECTION OF PROSTATE N/A 09/02/2015   Procedure: TRANSURETHRAL RESECTION OF THE PROSTATE (TURP) AND PROSTATE BIOPSY;  Surgeon: Bjorn PippinJohn Wrenn, MD;  Location: [facility];  Service: Urology;  Laterality: N/A;       Home Medications    Prior to Admission medications   Medication Sig Start Date End Date Taking? Authorizing Provider  acetaminophen (TYLENOL) 500 MG tablet Take 500-1,000 mg by mouth 3 (three) times daily as needed for mild pain.  [provider]  AMINO ACIDS COMPLEX PO Take 1 tablet by mouth daily.     [provider]  Ascorbic Acid (VITAMIN C) 1000 MG tablet Take 1,000 mg by mouth daily.     [provider]  aspirin EC 81 MG tablet Take 81 mg by mouth daily.    [provider]  Calcium Carbonate-Simethicone (ROLAIDS MULTI-SYMPTOM PO) Take 2 tablets by mouth every 6 (six) hours as needed (heartburn).     [provider]  chlorthalidone (HYGROTON) 25 MG tablet Take 25 mg by mouth daily. 06/05/16 06/05/17   [provider]  Coenzyme Q10 (CO Q 10) 100 MG CAPS Take 1 capsule by mouth 2 (two) times daily.    [provider]  doxazosin (CARDURA) 2 MG tablet Take 2 mg by mouth at bedtime. 06/05/16 06/05/17  [provider]  fish oil-omega-3 fatty acids 1000 MG capsule Take 2 g by mouth 2 (two) times daily.    [provider]  fluticasone (FLONASE) 50 MCG/ACT nasal spray Place 2 sprays into the nose daily as needed for allergies.     [provider]  Garlic 200 MG TABS Take 400 mg by mouth 2 (two) times daily.    [provider]  Ginkgo Biloba 120 MG TABS Take 120 mg by mouth daily.    [provider]  Glucosamine Sulfate 1000 MG CAPS Take 1,000 mg by mouth 2 (two) times daily.    [provider]  Hyaluronic Acid-Vitamin C (HYALURONIC ACID PO) Take 1 tablet by mouth daily.    [provider]  LECITHIN PO Take 1 tablet by mouth 2 (two) times daily.    [provider]  lisinopril (PRINIVIL,ZESTRIL) 20 MG tablet Take 20 mg by mouth 2 (two) times daily.    [provider]  metFORMIN (GLUCOPHAGE) 1000 MG tablet Take 500 mg by mouth 2 (two) times daily. 03/15/16   [provider]  Methylsulfonylmethane (MSM) 1000 MG TABS Take 1 tablet by mouth daily.     [provider]  Multiple Vitamin (MULTIVITAMIN WITH MINERALS) TABS Take 1 tablet by mouth 2 (two) times daily.     [provider]  naproxen sodium (ALEVE) 220 MG tablet Take 110-220 mg by mouth daily as needed (pain).    [provider]  niacin (SLO-NIACIN) 500 MG tablet Take 500 mg by mouth at bedtime.    [provider]  Psyllium (METAMUCIL FIBER PO) Take 1 each by mouth at bedtime. 2 tablespoons mixes up with cinnamon, vinegar, lemon juice    [provider]  ranitidine (ZANTAC) 300 MG tablet Take 300 mg by mouth 2 (two) times daily. 04/02/16   [provider]  sildenafil (VIAGRA) 50 MG tablet Take 50 mg  by mouth daily as needed for erectile dysfunction.    [provider]  simvastatin (ZOCOR) 40 MG tablet Take 20 mg by mouth at bedtime. 04/20/16   [provider]  sotalol (BETAPACE) 120 MG tablet Take 120 mg by mouth 2 (two) times daily.    [provider]  tetrahydrozoline-zinc (VISINE-AC) 0.05-0.25 % ophthalmic solution Place 2 drops into both eyes 3 (three) times daily as needed (dry eyes).    [provider]  VANADYL SULFATE PO Take 1 tablet by mouth daily.     [provider]  vitamin B-12 (CYANOCOBALAMIN) 1000 MCG tablet Take 1,000 mcg by mouth daily.    [provider]    Family History No family history on  file.  Social History Social History   Tobacco Use  . Smoking status: Former Smoker    Packs/day: 1.00    Years: 15.00    Pack years: 15.00    Types: Cigarettes    Last attempt to quit: 01/30/1978    Years since quitting: 39.1  . Smokeless tobacco: Current User    Types: Chew, Snuff  . Tobacco comment: current use of smokeless tobacco  Substance Use Topics  . Alcohol use: No  . Drug use: No     Allergies   Ciprofloxacin; Adhesive [tape]; Penicillins; and Prednisone   Review of Systems Review of Systems  Musculoskeletal: Positive for back pain.  All other systems reviewed and are negative.    Physical Exam Updated Vital Signs BP (!) 181/74 (BP Location: Right Arm)   Pulse 62   Temp 98.7 F (37.1 C) (Oral)   Resp 18   Ht 5\' 9"  (1.753 m)   Wt 88.5 kg (195 lb)   SpO2 100%   BMI 28.80 kg/m   Physical Exam  Constitutional: He appears well-developed and well-nourished.  HENT:  Head: Normocephalic and atraumatic.  Eyes: Conjunctivae are normal.  Neck: Neck supple.  Cardiovascular: Normal rate and regular rhythm.  No murmur heard. Pulmonary/Chest: Effort normal and breath sounds normal. No respiratory distress.  Abdominal: Soft. There is no tenderness.  Musculoskeletal: He exhibits tenderness. He  exhibits no edema.  Pain with movement  Neurological: He is alert.  Skin: Skin is warm and dry.  Psychiatric: He has a normal mood and affect.  Nursing note and vitals reviewed.    ED Treatments / Results  Labs (all labs ordered are listed, but only abnormal results are displayed) Labs Reviewed - No data to display  EKG  EKG Interpretation None       Radiology No results found.  Procedures Procedures (including critical care time)  Medications Ordered in ED Medications - No data to display   Initial Impression / Assessment and Plan / ED Course  I have reviewed the triage vital signs and the nursing notes.  Pertinent labs & imaging results that were available during my care of the patient were reviewed by me and considered in my medical decision making (see chart for details).     MDM:  Pt given dilaudid IM.  He reports some relief initially.  On revaluation pt ask if we can xray his hip.   Pt given clonidine and his regular pain medication  Percocet.   Pt's blood pressure has improved.   Hip xray shows arthritis.  Pt seems confused about his medications.  I suspect he is not taking appropriately.   I advised pt and Daughter to put medications in pill box to make sure he takes as directed.   Final Clinical Impressions(s) / ED Diagnoses   Final diagnoses:  Acute left-sided low back pain, with sciatica presence unspecified  Hypertension, unspecified type    ED Discharge Orders    None    An After Visit Summary was printed and given to the patient.    Elson Areas, Cordelia Poche 03/12/17 1547    Benjiman Core, MD 03/12/17 1620

## 2017-03-12 NOTE — ED Notes (Signed)
ED Provider at bedside. 

## 2017-03-12 NOTE — ED Notes (Signed)
Pt stat xhe understands instructions. Daughter stqaes same. Home stable

## 2017-03-12 NOTE — Discharge Instructions (Signed)
Take your medications as directed.  Decrease salt in your diet.  See your Physician for pain management.  Follow up with Neurosurgery as scheduled

## 2017-03-12 NOTE — ED Triage Notes (Signed)
States he has had back surgery on his back several months ago , was at the dr office this am and was getting a steroid shot  However his b/p; was too high and he was sent to ED .

## 2017-03-12 NOTE — ED Notes (Signed)
Gave patient the urinal family at bedside and call bell at reach

## 2017-03-12 NOTE — ED Notes (Signed)
Pt sent to the ED from Dr. Yetta BarreJones (Neurosurgeon) office-- had an appt, was going to get a steroid injection, but his BP was too high-- sent for BP issues- Pt has not been taking BP med -- private MD stopped it, but cardiologist wanted him to start taking it again-- pt has taken lisinopril 3 days ago--

## 2017-03-14 ENCOUNTER — Other Ambulatory Visit: Payer: Self-pay | Admitting: Student

## 2017-03-14 DIAGNOSIS — M5416 Radiculopathy, lumbar region: Secondary | ICD-10-CM

## 2017-03-21 ENCOUNTER — Ambulatory Visit
Admission: RE | Admit: 2017-03-21 | Discharge: 2017-03-21 | Disposition: A | Discharge status: Home / Self Care | Payer: PPO | Source: Ambulatory Visit | Attending: Student | Admitting: Student

## 2017-03-21 DIAGNOSIS — F32A Depression, unspecified: Secondary | ICD-10-CM | POA: Insufficient documentation

## 2017-03-21 DIAGNOSIS — F329 Major depressive disorder, single episode, unspecified: Secondary | ICD-10-CM | POA: Insufficient documentation

## 2017-03-21 DIAGNOSIS — M1990 Unspecified osteoarthritis, unspecified site: Secondary | ICD-10-CM | POA: Insufficient documentation

## 2017-03-21 DIAGNOSIS — G629 Polyneuropathy, unspecified: Secondary | ICD-10-CM | POA: Insufficient documentation

## 2017-03-21 DIAGNOSIS — M5416 Radiculopathy, lumbar region: Secondary | ICD-10-CM

## 2017-03-21 DIAGNOSIS — M4727 Other spondylosis with radiculopathy, lumbosacral region: Secondary | ICD-10-CM | POA: Diagnosis not present

## 2017-03-21 MED ORDER — IOPAMIDOL (ISOVUE-M 200) INJECTION 41%
1.0000 mL | Freq: Once | INTRAMUSCULAR | Status: AC
  Administered 2017-03-21: 1 mL via EPIDURAL

## 2017-03-21 MED ORDER — METHYLPREDNISOLONE ACETATE 40 MG/ML INJ SUSP (RADIOLOG
120.0000 mg | Freq: Once | INTRAMUSCULAR | Status: AC
  Administered 2017-03-21: 120 mg via EPIDURAL

## 2017-03-21 NOTE — Discharge Instructions (Signed)

## 2017-04-09 DIAGNOSIS — M25552 Pain in left hip: Secondary | ICD-10-CM | POA: Diagnosis not present

## 2017-04-18 DIAGNOSIS — M25552 Pain in left hip: Secondary | ICD-10-CM | POA: Diagnosis not present

## 2017-04-18 DIAGNOSIS — M24152 Other articular cartilage disorders, left hip: Secondary | ICD-10-CM | POA: Diagnosis not present

## 2017-04-24 DIAGNOSIS — Z6828 Body mass index (BMI) 28.0-28.9, adult: Secondary | ICD-10-CM | POA: Diagnosis not present

## 2017-04-24 DIAGNOSIS — M25552 Pain in left hip: Secondary | ICD-10-CM | POA: Diagnosis not present

## 2017-04-24 DIAGNOSIS — I10 Essential (primary) hypertension: Secondary | ICD-10-CM | POA: Diagnosis not present

## 2017-05-03 ENCOUNTER — Telehealth (INDEPENDENT_AMBULATORY_CARE_PROVIDER_SITE_OTHER): Payer: Self-pay | Admitting: Physical Medicine and Rehabilitation

## 2017-05-03 ENCOUNTER — Ambulatory Visit (INDEPENDENT_AMBULATORY_CARE_PROVIDER_SITE_OTHER): Payer: PPO | Admitting: Orthopaedic Surgery

## 2017-05-03 ENCOUNTER — Other Ambulatory Visit (INDEPENDENT_AMBULATORY_CARE_PROVIDER_SITE_OTHER): Payer: Self-pay

## 2017-05-03 ENCOUNTER — Encounter (INDEPENDENT_AMBULATORY_CARE_PROVIDER_SITE_OTHER): Payer: Self-pay | Admitting: Orthopaedic Surgery

## 2017-05-03 DIAGNOSIS — M25552 Pain in left hip: Secondary | ICD-10-CM | POA: Diagnosis not present

## 2017-05-03 NOTE — Progress Notes (Signed)
Office Visit Note   Patient: Frank Hill           Date of Birth: [birth year]           MRN: [id number] Visit Date: 05/03/2017              Requested by: Tia AlertJones, David S, MD [address] [place], [place] [address] PCP: System, Pcp Not In   Assessment & Plan: Visit Diagnoses:  1. Pain of left hip joint     Plan: Certainly his x-ray and MR point to moderate arthritis of the left hip joint however his clinical exam does not show any significant discomfort in the hip.  He keeps pointing to the posterior pelvis area as a source of his pain but this is not over the issue him either.  I would like to set him up for 2 injections with Dr. Alvester MorinNewton.  I am looking for this hopefully being therapeutic and diagnostic.  One would be more likely a left-sided SI joint injection but also a left hip intra-articular injection.  All questions concerns were answered and addressed.  We will work on setting him up for these injections and then I will see him back myself in 4 weeks.  Follow-Up Instructions: Return in about 1 month (around 05/31/2017).   Orders:  No orders of the defined types were placed in this encounter.  No orders of the defined types were placed in this encounter.     Procedures: No procedures performed   Clinical Data: No additional findings.   Subjective: Chief Complaint  Patient presents with  . Left Hip - Pain  Patient is a very pleasant and active 77 year old gentleman who comes for evaluation treatment of left hip pain.  He is actually referred from Dr. Marikay Alaravid Jones from neurosurgery here in town.  He is had recent lumbar spine surgery and has had his back worked up again since then but he still having some pain and he points around the posterior pelvis area and some into the hip as a source of this pain.  [facility] and had an intra-articular steroid injection in his right hip followed by lumbar  spine injection.  It was felt at the time he ended up needing surgical intervention his lumbar spine due to herniated disc and Dr. Yetta BarreJones performed the surgery.  He has had recent plain films of his pelvis and left hip as well as an MRI and they are on the system for me to review.  Again he points to more of the SI joint in the lateral hip as source of his pain and denies any pain in the groin.  His daughter is with him today.  She is concerned that the MRI did show some tearing of tenderness.  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  He has no right-sided pain at all.  He does report bilateral knee pain with popping sensations in both his knees.  He walks without assistive device.    HPI  Review of Systems He currently denies any headache, chest pain, shortness of breath, fever, chills, nausea, vomiting.  Objective: Vital Signs: There were no vitals taken for this visit.  Physical Exam He is alert and oriented x3 and in no acute distress Ortho Exam Examination of his left hip is almost essentially normal.  I can put him through the extremes  of internal and external rotation and compression of the hip joint itself and he does not seem to have any problems with this.  I then laid him on his side with his left hip up and he has some slight pain over palpation of the greater trochanteric area.  There is no significant pain over the issue him at all.  He has some pain in the posterior pelvis and some around the SI joint.  Plain films and MRI Specialty Comments:  No specialty comments available.  Imaging: No results found. Of his left hip and pelvis are reviewed and he does have moderate arthritis of the left hip especially seen on plain film.  There is tendinosis and partial tearing of the hamstrings at their origin.  There is no hip joint effusion and there are some degenerative labral tearing as well.  PMFS History: Patient  Active Problem List   Diagnosis Date Noted  . Arthritis 03/21/2017  . Depression 03/21/2017  . Neuropathy 03/21/2017  . Salmonella gastroenteritis 09/08/2016  . Type 2 diabetes mellitus, without long-term current use of insulin (HCC) 09/05/2016  . S/P lumbar laminectomy 06/08/2016  . Elevated PSA 09/03/2015  . Bladder calculi 09/03/2015  . Ureteral calculus, right 09/03/2015  . BPH (benign prostatic hypertrophy) with urinary obstruction 09/02/2015  . Atrial flutter, paroxysmal (HCC) 11/25/2014  . Benign essential hypertension 11/25/2014  . Pure hypercholesterolemia 11/25/2014   Past Medical History:  Diagnosis Date  . Arthritis    osteoarthritis- hips  . Bladder stones   . Diabetes mellitus without complication (HCC)    no current meds  . Dysrhythmia    atrial flutter  . Endocarditis 2-3 yrs ago   12/2015 cardiology notes indicate pericarditis (not endocarditis)   . History of kidney stones    multiple times  . Hx of seasonal allergies   . Hypertension   . Impaired hearing    no hearing aids  . Rhinitis   . Snores    snores loudly on back    History reviewed. No pertinent family history.  Past Surgical History:  Procedure Laterality Date  . BLADDER STONE REMOVAL  yrs ago  . cystolithopaxy  yrs ago  . CYSTOSCOPY  yrs agoseveral done  . CYSTOSCOPY WITH RETROGRADE PYELOGRAM, URETEROSCOPY AND STENT PLACEMENT Left 08/15/2012   Procedure: CYSTOSCOPY LITHIOPEXY, URETEROSCOPY STONE EXTRACTION WITH HOLMIUM LASER AND STENT PLACEMENT;  Surgeon: Anner Crete, MD;  Location: [facility];  Service: Urology;  Laterality: Left;  . CYSTOSCOPY WITH RETROGRADE PYELOGRAM, URETEROSCOPY AND STENT PLACEMENT Right 09/02/2015   Procedure: CYSTOSCOPY WITH RIGHT RETROGRADE/URETEROSCOPY, CYSTOLITHOPAXY;  Surgeon: Bjorn Pippin, MD;  Location: [facility];  Service: Urology;  Laterality: Right;  . HOLMIUM LASER APPLICATION Left 08/15/2012   Procedure: HOLMIUM LASER APPLICATION;  Surgeon: Anner Crete, MD;   Location: [facility];  Service: Urology;  Laterality: Left;  . LITHOTRIPSY  15 yrs ago   several done  . LUMBAR LAMINECTOMY/DECOMPRESSION MICRODISCECTOMY Left 06/08/2016   Procedure: Laminectomy and Foraminotomy - Lumbar two -three - Lumbar three-four - Lumbar four-five , left;  Surgeon: Tia Alert, MD;  Location: [facility] OR;  Service: Neurosurgery;  Laterality: Left;  . TONSILLECTOMY  age 33  . TRANSURETHRAL RESECTION OF PROSTATE N/A 09/02/2015   Procedure: TRANSURETHRAL RESECTION OF THE PROSTATE (TURP) AND PROSTATE BIOPSY;  Surgeon: Bjorn Pippin, MD;  Location: [facility];  Service: Urology;  Laterality: N/A;   Social History   Occupational History  . Not on file  Tobacco Use  . Smoking  status: Former Smoker    Packs/day: 1.00    Years: 15.00    Pack years: 15.00    Types: Cigarettes    Last attempt to quit: 01/30/1978    Years since quitting: 39.2  . Smokeless tobacco: Current User    Types: Chew, Snuff  . Tobacco comment: current use of smokeless tobacco  Substance and Sexual Activity  . Alcohol use: No  . Drug use: No  . Sexual activity: Not on file

## 2017-05-03 NOTE — Telephone Encounter (Signed)
Dr. Magnus IvanBlackman will be putting in a referral for Dr. Alvester MorinNewton for this patient, his daughter Drinda Buttsnnette asks that you call her as well as the patient when setting up the appt because he is hard of hearing and she wants to make sure he has the correct appt time.

## 2017-05-04 NOTE — Telephone Encounter (Signed)
Pt is scheduled for 05/11/17

## 2017-05-11 ENCOUNTER — Ambulatory Visit (INDEPENDENT_AMBULATORY_CARE_PROVIDER_SITE_OTHER): Payer: PPO

## 2017-05-11 ENCOUNTER — Encounter (INDEPENDENT_AMBULATORY_CARE_PROVIDER_SITE_OTHER): Payer: Self-pay | Admitting: Physical Medicine and Rehabilitation

## 2017-05-11 ENCOUNTER — Ambulatory Visit (INDEPENDENT_AMBULATORY_CARE_PROVIDER_SITE_OTHER): Payer: PPO | Admitting: Physical Medicine and Rehabilitation

## 2017-05-11 DIAGNOSIS — M533 Sacrococcygeal disorders, not elsewhere classified: Secondary | ICD-10-CM | POA: Diagnosis not present

## 2017-05-11 DIAGNOSIS — M25552 Pain in left hip: Secondary | ICD-10-CM

## 2017-05-11 NOTE — Progress Notes (Signed)
 .  Numeric Pain Rating Scale and Functional Assessment Average Pain 8   In the last MONTH (on 0-10 scale) has pain interfered with the following?  1. General activity like being  able to carry out your everyday physical activities such as walking, climbing stairs, carrying groceries, or moving a chair?  Rating(7)   +Driver, -BT, -Dye Allergies.  

## 2017-05-11 NOTE — Progress Notes (Signed)
Darlin PriestlyKenneth A Hafner - 77 y.o. male MRN [id number]  Date of birth: 1940/04/13  Office Visit Note: Visit Date: 05/11/2017 PCP: System, Pcp Not In Referred by: No ref. provider found  Subjective: Chief Complaint  Patient presents with  . Left Hip - Pain   HPI: Mr. Frank Hill is a 77 year old gentleman accompanied by his daughter who provides some of the history.  He comes in today at the request of Dr. Magnus IvanBlackman for left sided sacroiliac joint injection and intra-articular hip injection.  He has had a complicated course of spine surgery last year by Dr. Yetta BarreJones but still continued left posterior hip and buttock pain.  He denies much in the way of groin pain.  He does have degenerative changes of the left hip.  Prior left hip injection was performed by Dr. Norval Gableeynoso.  Really unsure from a history standpoint how much relief was obtained from that.  Patient is somewhat of a poor historian.  Nonetheless he is having posterior pain over the PSIS area and sacroiliac joint.  We are going to complete a diagnostic hopefully therapeutic injection of both the sacroiliac joint and left hip joint.  Patient will follow-up with Dr. Magnus IvanBlackman and make notes of any improvement of pain.   ROS Otherwise per HPI.  Assessment & Plan: Visit Diagnoses:  1. Pain in left hip   2. Sacroiliac dysfunction     Plan: Findings:  Diagnostic and hopefully therapeutic left hip anesthetic arthrogram as well as left sacroiliac joint injection.  Patient felt like the pain pattern introduced when completing the sacroiliac joint injection was right over the spot where he has pain.  During the anesthetic portion of the hip arthrogram the patient felt some relief but not dramatic.    Meds & Orders: No orders of the defined types were placed in this encounter.   Orders Placed This Encounter  Procedures  . Large Joint Inj: L hip joint  . Sacroiliac Joint Inj  . XR C-ARM NO REPORT    Follow-up: Return for Dr. Magnus IvanBlackman.    Procedures: Large Joint Inj: L hip joint on 05/11/2017 9:07 AM Indications: pain and diagnostic evaluation Details: 22 G needle, anterior approach  Arthrogram: Yes  Medications: 3 mL bupivacaine 0.5 %; 40 mg triamcinolone acetonide 40 MG/ML; 80 mg triamcinolone acetonide 40 MG/ML Outcome: tolerated well, no immediate complications  Arthrogram demonstrated excellent flow of contrast throughout the joint surface without extravasation or obvious defect.  The patient had some mild relief of symptoms during the anesthetic phase of the injection.  Procedure, treatment alternatives, risks and benefits explained, specific risks discussed. Consent was given by the patient. Immediately prior to procedure a time out was called to verify the correct patient, procedure, equipment, support staff and site/side marked as required. Patient was prepped and draped in the usual sterile fashion.   Sacroiliac Joint Inj on 05/11/2017 9:08 AM Indications: pain and diagnostic evaluation Details: 22 G 3.5 in needle, fluoroscopy-guided posterior approach Medications: 2 mL bupivacaine 0.5 %; 60 mg triamcinolone acetonide 40 MG/ML Outcome: tolerated well, no immediate complications  There was excellent flow of contrast producing a partial arthrogram of the sacroiliac joint.  Procedure, treatment alternatives, risks and benefits explained, specific risks discussed. Consent was given by the patient. Immediately prior to procedure a time out was called to verify the correct patient, procedure, equipment, support staff and site/side marked as required. Patient was prepped and draped in the usual sterile fashion.      No notes on file  Clinical History: No specialty comments available.   He reports that he quit smoking about 39 years ago. His smoking use included cigarettes. He has a 15.00 pack-year smoking history. His smokeless tobacco use includes chew and snuff.  Recent Labs    05/31/16 1458  HGBA1C 7.0*     Objective:  VS:  HT:    WT:   BMI:     BP:   HR: bpm  TEMP: ( )  RESP:  Physical Exam  Musculoskeletal:  Patient is somewhat slow to rise from a seated position and does have pain over the left sacroiliac joint region and PSIS.  He has very stiff hip with rotation but does not reproduce any groin pain.    Ortho Exam Imaging: No results found.  Past Medical/Family/Surgical/Social History: Medications & Allergies reviewed per EMR, new medications updated. Patient Active Problem List   Diagnosis Date Noted  . Arthritis 03/21/2017  . Depression 03/21/2017  . Neuropathy 03/21/2017  . Salmonella gastroenteritis 09/08/2016  . Type 2 diabetes mellitus, without long-term current use of insulin (HCC) 09/05/2016  . S/P lumbar laminectomy 06/08/2016  . Elevated PSA 09/03/2015  . Bladder calculi 09/03/2015  . Ureteral calculus, right 09/03/2015  . BPH (benign prostatic hypertrophy) with urinary obstruction 09/02/2015  . Atrial flutter, paroxysmal (HCC) 11/25/2014  . Benign essential hypertension 11/25/2014  . Pure hypercholesterolemia 11/25/2014   Past Medical History:  Diagnosis Date  . Arthritis    osteoarthritis- hips  . Bladder stones   . Diabetes mellitus without complication (HCC)    no current meds  . Dysrhythmia    atrial flutter  . Endocarditis 2-3 yrs ago   12/2015 cardiology notes indicate pericarditis (not endocarditis)   . History of kidney stones    multiple times  . Hx of seasonal allergies   . Hypertension   . Impaired hearing    no hearing aids  . Rhinitis   . Snores    snores loudly on back   History reviewed. No pertinent family history. Past Surgical History:  Procedure Laterality Date  . BLADDER STONE REMOVAL  yrs ago  . cystolithopaxy  yrs ago  . CYSTOSCOPY  yrs agoseveral done  . CYSTOSCOPY WITH RETROGRADE PYELOGRAM, URETEROSCOPY AND STENT PLACEMENT Left 08/15/2012   Procedure: CYSTOSCOPY LITHIOPEXY, URETEROSCOPY STONE EXTRACTION WITH  HOLMIUM LASER AND STENT PLACEMENT;  Surgeon: Anner Crete, MD;  Location: [facility];  Service: Urology;  Laterality: Left;  . CYSTOSCOPY WITH RETROGRADE PYELOGRAM, URETEROSCOPY AND STENT PLACEMENT Right 09/02/2015   Procedure: CYSTOSCOPY WITH RIGHT RETROGRADE/URETEROSCOPY, CYSTOLITHOPAXY;  Surgeon: Bjorn Pippin, MD;  Location: [facility];  Service: Urology;  Laterality: Right;  . HOLMIUM LASER APPLICATION Left 08/15/2012   Procedure: HOLMIUM LASER APPLICATION;  Surgeon: Anner Crete, MD;  Location: [facility];  Service: Urology;  Laterality: Left;  . LITHOTRIPSY  15 yrs ago   several done  . LUMBAR LAMINECTOMY/DECOMPRESSION MICRODISCECTOMY Left 06/08/2016   Procedure: Laminectomy and Foraminotomy - Lumbar two -three - Lumbar three-four - Lumbar four-five , left;  Surgeon: Tia Alert, MD;  Location[facility];  Service: Neurosurgery;  Laterality: Left;  . TONSILLECTOMY  age 54  . TRANSURETHRAL RESECTION OF PROSTATE N/A 09/02/2015   Procedure: TRANSURETHRAL RESECTION OF THE PROSTATE (TURP) AND PROSTATE BIOPSY;  Surgeon: Bjorn Pippin, MD;  Location: [facility];  Service: Urology;  Laterality: N/A;   Social History   Occupational History  . Not on file  Tobacco Use  . Smoking status: Former Smoker  Packs/day: 1.00    Years: 15.00    Pack years: 15.00    Types: Cigarettes    Last attempt to quit: 01/30/1978    Years since quitting: 39.3  . Smokeless tobacco: Current User    Types: Chew, Snuff  . Tobacco comment: current use of smokeless tobacco  Substance and Sexual Activity  . Alcohol use: No  . Drug use: No  . Sexual activity: Not on file

## 2017-05-11 NOTE — Patient Instructions (Signed)

## 2017-05-16 ENCOUNTER — Ambulatory Visit (INDEPENDENT_AMBULATORY_CARE_PROVIDER_SITE_OTHER): Payer: PPO | Admitting: Orthopaedic Surgery

## 2017-05-16 VITALS — Ht 69.0 in | Wt 195.0 lb

## 2017-05-16 DIAGNOSIS — Z9889 Other specified postprocedural states: Secondary | ICD-10-CM | POA: Diagnosis not present

## 2017-05-16 DIAGNOSIS — M1612 Unilateral primary osteoarthritis, left hip: Secondary | ICD-10-CM | POA: Diagnosis not present

## 2017-05-16 MED ORDER — TRIAMCINOLONE ACETONIDE 40 MG/ML IJ SUSP
60.0000 mg | INTRAMUSCULAR | Status: AC | PRN
  Administered 2017-05-11: 60 mg via INTRA_ARTICULAR

## 2017-05-16 MED ORDER — TRIAMCINOLONE ACETONIDE 40 MG/ML IJ SUSP
80.0000 mg | INTRAMUSCULAR | Status: AC | PRN
  Administered 2017-05-11: 80 mg via INTRA_ARTICULAR

## 2017-05-16 MED ORDER — BUPIVACAINE HCL 0.5 % IJ SOLN
3.0000 mL | INTRAMUSCULAR | Status: AC | PRN
  Administered 2017-05-11: 3 mL via INTRA_ARTICULAR

## 2017-05-16 MED ORDER — BUPIVACAINE HCL 0.5 % IJ SOLN
2.0000 mL | INTRAMUSCULAR | Status: AC | PRN
  Administered 2017-05-11: 2 mL via INTRA_ARTICULAR

## 2017-05-16 MED ORDER — TRIAMCINOLONE ACETONIDE 40 MG/ML IJ SUSP
40.0000 mg | INTRAMUSCULAR | Status: AC | PRN
  Administered 2017-05-11: 40 mg via INTRA_ARTICULAR

## 2017-05-16 NOTE — Progress Notes (Signed)
The patient is here for follow-up after having both an intra-articular injection in his left hip joint by Dr. Alvester MorinNewton under direct fluoroscopy as well as an SI joint injection.  He said neither of these help with the pain that he is having.  He still having numbness and tingling down his thigh with thigh pain.  He still has posterior pelvis pain and odd sensation in his left calf.  He still denies any groin pain at all.  We went over his x-rays again he does have moderate arthritis in his left hip which is more significant than his right hip.  On exam I can still put his left hip through full internal and external rotation as well as flexion extension and it does not hurt him at all.  He says the injections by Dr. Alvester MorinNewton had no effect on him in terms of pain at all.  At this point I do feel that he is to see his neurosurgeon again Dr. 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  He did have an L2-L3 epidural steroid injection in February and that provided only slight relief.  Certainly he can have a hip replacement on his left side but I do not think it would treat his pain that he is having significantly given his clinical exam findings.  All questions concerns were answered and addressed.  He understands I am happy to see him back for his hip any time but I do feel he needs follow-up with his neurosurgeon.

## 2017-05-22 DIAGNOSIS — E785 Hyperlipidemia, unspecified: Secondary | ICD-10-CM | POA: Diagnosis not present

## 2017-05-22 DIAGNOSIS — Z125 Encounter for screening for malignant neoplasm of prostate: Secondary | ICD-10-CM | POA: Diagnosis not present

## 2017-05-22 DIAGNOSIS — E559 Vitamin D deficiency, unspecified: Secondary | ICD-10-CM | POA: Diagnosis not present

## 2017-05-22 DIAGNOSIS — E119 Type 2 diabetes mellitus without complications: Secondary | ICD-10-CM | POA: Diagnosis not present

## 2017-05-22 DIAGNOSIS — G933 Postviral fatigue syndrome: Secondary | ICD-10-CM | POA: Diagnosis not present

## 2017-05-22 DIAGNOSIS — R5383 Other fatigue: Secondary | ICD-10-CM | POA: Diagnosis not present

## 2017-05-22 DIAGNOSIS — Z09 Encounter for follow-up examination after completed treatment for conditions other than malignant neoplasm: Secondary | ICD-10-CM | POA: Diagnosis not present

## 2017-05-29 DIAGNOSIS — M5416 Radiculopathy, lumbar region: Secondary | ICD-10-CM | POA: Diagnosis not present

## 2017-05-29 DIAGNOSIS — Z6828 Body mass index (BMI) 28.0-28.9, adult: Secondary | ICD-10-CM | POA: Diagnosis not present

## 2017-05-29 DIAGNOSIS — I10 Essential (primary) hypertension: Secondary | ICD-10-CM | POA: Diagnosis not present

## 2017-05-30 ENCOUNTER — Other Ambulatory Visit: Payer: Self-pay | Admitting: Neurological Surgery

## 2017-05-30 DIAGNOSIS — M791 Myalgia, unspecified site: Secondary | ICD-10-CM | POA: Diagnosis not present

## 2017-05-30 DIAGNOSIS — M5430 Sciatica, unspecified side: Secondary | ICD-10-CM | POA: Diagnosis not present

## 2017-05-30 DIAGNOSIS — E119 Type 2 diabetes mellitus without complications: Secondary | ICD-10-CM | POA: Diagnosis not present

## 2017-05-30 DIAGNOSIS — R030 Elevated blood-pressure reading, without diagnosis of hypertension: Secondary | ICD-10-CM | POA: Diagnosis not present

## 2017-05-30 DIAGNOSIS — M5416 Radiculopathy, lumbar region: Secondary | ICD-10-CM

## 2017-05-30 NOTE — Progress Notes (Signed)
Phone call to patient to verify medication list and allergies for myelogram procedure. Pt informed he will need to stop Tramadol 48hrs prior to appointment time. Pt verbalized understanding.

## 2017-06-11 ENCOUNTER — Ambulatory Visit
Admission: RE | Admit: 2017-06-11 | Discharge: 2017-06-11 | Disposition: A | Discharge status: Home / Self Care | Payer: PPO | Source: Ambulatory Visit | Attending: Neurological Surgery | Admitting: Neurological Surgery

## 2017-06-11 DIAGNOSIS — M48061 Spinal stenosis, lumbar region without neurogenic claudication: Secondary | ICD-10-CM | POA: Diagnosis not present

## 2017-06-11 DIAGNOSIS — M5416 Radiculopathy, lumbar region: Secondary | ICD-10-CM

## 2017-06-11 MED ORDER — DIAZEPAM 5 MG PO TABS: 5.0000 mg | ORAL_TABLET | Freq: Once | ORAL | Status: DC

## 2017-06-11 MED ORDER — IOPAMIDOL (ISOVUE-M 200) INJECTION 41%
15.0000 mL | Freq: Once | INTRAMUSCULAR | Status: AC
  Administered 2017-06-11: 15 mL via INTRATHECAL

## 2017-06-11 MED ORDER — DIAZEPAM 5 MG PO TABS
10.0000 mg | ORAL_TABLET | Freq: Once | ORAL | Status: AC
  Administered 2017-06-11: 10 mg via ORAL

## 2017-06-11 MED ORDER — MEPERIDINE HCL 100 MG/ML IJ SOLN
75.0000 mg | Freq: Once | INTRAMUSCULAR | Status: AC
  Administered 2017-06-11: 75 mg via INTRAMUSCULAR

## 2017-06-11 MED ORDER — ONDANSETRON HCL 4 MG/2ML IJ SOLN
4.0000 mg | Freq: Once | INTRAMUSCULAR | Status: AC
  Administered 2017-06-11: 4 mg via INTRAMUSCULAR

## 2017-06-11 NOTE — Discharge Instructions (Signed)

## 2017-06-12 DIAGNOSIS — M9983 Other biomechanical lesions of lumbar region: Secondary | ICD-10-CM | POA: Diagnosis not present

## 2017-06-12 DIAGNOSIS — G629 Polyneuropathy, unspecified: Secondary | ICD-10-CM | POA: Diagnosis not present

## 2017-06-12 DIAGNOSIS — M5416 Radiculopathy, lumbar region: Secondary | ICD-10-CM | POA: Diagnosis not present

## 2017-07-02 ENCOUNTER — Other Ambulatory Visit: Payer: Self-pay | Admitting: Neurological Surgery

## 2017-07-02 DIAGNOSIS — M9983 Other biomechanical lesions of lumbar region: Secondary | ICD-10-CM | POA: Diagnosis not present

## 2017-07-10 NOTE — Pre-Procedure Instructions (Signed)
Frank Hill  07/10/2017      [facility] - [place], [place] - [address] [place] [id number] Phone: [phone] Fax: [phone]    Your procedure is scheduled on 07/18/2017.  [facility] at 1000 A.M.  Call this number if you have problems the morning of surgery:  [phone]   Remember:  Do not eat or drink after midnight the night before your surgery.   Continue all medications as directed by your physician except follow these medication instructions before surgery below     Take these medicines the morning of surgery with A SIP OF WATER: Amlodipine (Norvasc) Fluticasone (Flonase) - if needed Oxycodone-acetaminophen (Percocet) - if needed Ranitidine (Zantac) Sotalol (Betapace) Eye drops - if needed Tramadol (Ultram) - if needed  7 days prior to surgery STOP taking any Meloxicam (Mobic), Aspirin (unless otherwise instructed by your surgeon), Aleve, Naproxen, Ibuprofen, Motrin, Advil, Goody's, BC's, all herbal medications, fish oil, and all vitamins   WHAT DO I DO ABOUT MY DIABETES MEDICATION? Marland Kitchen. Do not take oral diabetes medicines (pills) the morning of surgery. - Do not take your metformin the morning of surgery   How to Manage Your Diabetes Before and After Surgery  Why is it important to control my blood sugar before and after surgery? . Improving blood sugar levels before and after surgery helps healing and can limit problems. . A way of improving blood sugar control is eating a healthy diet by: o  Eating less sugar and carbohydrates o  Increasing activity/exercise o  Talking with your doctor about reaching your blood sugar goals . High blood sugars (greater than 180 mg/dL) can raise your risk of infections and slow your recovery, so you will need to focus on controlling your diabetes during the weeks before surgery. . Make sure that the doctor who takes care of your diabetes knows  about your planned surgery including the date and location.  How do I manage my blood sugar before surgery? . Check your blood sugar at least 4 times a day, starting 2 days before surgery, to make sure that the level is not too high or low. o Check your blood sugar the morning of your surgery when you wake up and every 2 hours until you get to the Short Stay unit. . If your blood sugar is less than 70 mg/dL, you will need to treat for low blood sugar: o Do not take insulin. o Treat a low blood sugar (less than 70 mg/dL) with  cup of clear juice (cranberry or apple), 4 glucose tablets, OR glucose gel. o Recheck blood sugar in 15 minutes after treatment (to make sure it is greater than 70 mg/dL). If your blood sugar is not greater than 70 mg/dL on recheck, call [phone] for further instructions. . Report your blood sugar to the short stay nurse when you get to Short Stay.  . If you are admitted to the hospital after surgery: o Your blood sugar will be checked by the staff and you will probably be given insulin after surgery (instead of oral diabetes medicines) to make sure you have good blood sugar levels. o The goal for blood sugar control after surgery is 80-180 mg/dL.      Do not wear jewelry  Do not wear lotions, powders, or colognes, or deodorant.  Men may shave face and neck.  Do not bring valuables to the hospital.  [facility] is not responsible for any belongings or valuables.  Hearing aids, eyeglasses, contacts, dentures or bridgework may not be worn into surgery.  Leave your suitcase in the car.  After surgery it may be brought to your room.  For patients admitted to the hospital, discharge time will be determined by your treatment team.  Patients discharged the day of surgery will not be allowed to drive home.   Name and phone number of your driver:    Special instructions:   [facility]- Preparing For Surgery  Before surgery, you can play an important role. Because  skin is not sterile, your skin needs to be as free of germs as possible. You can reduce the number of germs on your skin by washing with CHG (chlorahexidine gluconate) Soap before surgery.  CHG is an antiseptic cleaner which kills germs and bonds with the skin to continue killing germs even after washing.    Oral Hygiene is also important to reduce your risk of infection.  Remember - BRUSH YOUR TEETH THE MORNING OF SURGERY WITH YOUR REGULAR TOOTHPASTE  Please do not use if you have an allergy to CHG or antibacterial soaps. If your skin becomes reddened/irritated stop using the CHG.  Do not shave (including legs and underarms) for at least 48 hours prior to first CHG shower. It is OK to shave your face.  Please follow these instructions carefully.   1. Shower the NIGHT BEFORE SURGERY and the MORNING OF SURGERY with CHG.   2. If you chose to wash your hair, wash your hair first as usual with your normal shampoo.  3. After you shampoo, rinse your hair and body thoroughly to remove the shampoo.  4. Use CHG as you would any other liquid soap. You can apply CHG directly to the skin and wash gently with a scrungie or a clean washcloth.   5. Apply the CHG Soap to your body ONLY FROM THE NECK DOWN.  Do not use on open wounds or open sores. Avoid contact with your eyes, ears, mouth and genitals (private parts). Wash Face and genitals (private parts)  with your normal soap.  6. Wash thoroughly, paying special attention to the area where your surgery will be performed.  7. Thoroughly rinse your body with warm water from the neck down.  8. DO NOT shower/wash with your normal soap after using and rinsing off the CHG Soap.  9. Pat yourself dry with a CLEAN TOWEL.  10. Wear CLEAN PAJAMAS to bed the night before surgery, wear comfortable clothes the morning of surgery  11. Place CLEAN SHEETS on your bed the night of your first shower and DO NOT SLEEP WITH PETS.    Day of Surgery: Shower as stated  above. Do not apply any deodorants/lotions.  Please wear clean clothes to the hospital/surgery center.   Remember to brush your teeth WITH YOUR REGULAR TOOTHPASTE.    Please read over the following fact sheets that you were given.

## 2017-07-11 ENCOUNTER — Inpatient Hospital Stay (HOSPITAL_COMMUNITY)
Admission: RE | Admit: 2017-07-11 | Discharge: 2017-07-11 | Disposition: A | Discharge status: Home / Self Care | Payer: PPO | Source: Ambulatory Visit

## 2017-07-17 NOTE — Progress Notes (Signed)
Anesthesia Chart Review: SAME DAY WORK-UP  Case:  [id number] Date/Time:  07/18/17 1115   Procedure:  Re-do Laminectomy and Foraminotomy  - L2-L3 - L3-L4 - L4-L5, instrumented fusion L2-5 (N/A Back)   Anesthesia type:  General   Pre-op diagnosis:  Stenosis   [facility]:  [facility] ROOM 21 / [facility]   Surgeon:  Tia Alert, MD      DISCUSSION: Patient is a 77 year old male scheduled for the above procedure.  History includes former smoker (quit '80), hypertension, diabetes mellitus type 2 (diet controlled), atrial flutter in the setting of pericarditis (> 5 years ago), snoring, hard of hearing, L2-5 laminectomy/foraminotomy 06/08/16. By notes, patient's wife died of cirrhosis ~ 2017/01/07.  He was evaluated by cardiology ~ 6-7 months ago with no new testing ordered and one year follow-up recommended. If no acute changes and same day labs acceptable then I would anticipate that he can proceed as planned. Anesthesiologist to evaluate on the day of surgery.  PROVIDERS: PCP is not listed. In 2018, he was seeing Dr. Susa Loffler with [facility]. Cardiologist is Dr. Tollie Pizza with [facility]). Last visit 12/28/16 (see Care Everywhere) with one year follow-up recommended.   LABS: He is a same day work-up, so labs will be drawn on the day of surgery.  IMAGES:  CT L-spine 06/11/17: IMPRESSION: 1. 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 2. Severe foraminal narrowing bilaterally at L4-5 is worse on the right. 3. Postsurgical changes at L3-4 without residual or recurrent central canal stenosis. 4. Moderate foraminal narrowing at L3-4 is worse on the left. 5. Postsurgical changes at L2-3 with decompression of the posterior canal but residual broad-based disc protrusion and bilateral facet hypertrophy contributing to moderate right and mild left subarticular stenosis at L2-3. 6. Mild bilateral foraminal narrowing at L2-3. 7. Right  subarticular narrowing at L1-2 without significant foraminal stenosis.   EKG: 03/12/17 (done in ED for back pain evaluation): SR at 55 bpm, first degree AV block, non-specific T wave abnormality, lateral leads. Minimal ST elevation, anterior leads. Non-specific T wave abnormality is new in high lateral leads, but otherwise I think EKG is overall stable when compared to 12/13/15 tracing from Dr. Rosann Auerbach office. (Muse tracings from 08/31/15 and 06/08/16 also printed out and placed on patient's chart.)   CV: He had denied prior stress, echo, and cardiac cath, although I suspect he had an echocardiogram at some point, but not within the past 5 years.   Past Medical History:  Diagnosis Date  . Arthritis    osteoarthritis- hips  . Bladder stones   . Diabetes mellitus without complication (HCC)    no current meds  . Dysrhythmia    atrial flutter  . Endocarditis 2-3 yrs ago   01/08/16 cardiology notes indicate pericarditis (not endocarditis)   . History of kidney stones    multiple times  . Hx of seasonal allergies   . Hypertension   . Impaired hearing    no hearing aids  . Rhinitis   . Snores    snores loudly on back    Past Surgical History:  Procedure Laterality Date  . BLADDER STONE REMOVAL  yrs ago  . cystolithopaxy  yrs ago  . CYSTOSCOPY  yrs agoseveral done  . CYSTOSCOPY WITH RETROGRADE PYELOGRAM, URETEROSCOPY AND STENT PLACEMENT Left 08/15/2012   Procedure: CYSTOSCOPY LITHIOPEXY, URETEROSCOPY STONE EXTRACTION WITH HOLMIUM LASER AND STENT PLACEMENT;  Surgeon: Anner Crete, MD;  Location:  [facility];  Service: Urology;  Laterality: Left;  . CYSTOSCOPY WITH RETROGRADE PYELOGRAM, URETEROSCOPY AND STENT PLACEMENT Right 09/02/2015   Procedure: CYSTOSCOPY WITH RIGHT RETROGRADE/URETEROSCOPY, CYSTOLITHOPAXY;  Surgeon: Bjorn PippinJohn Wrenn, MD;  Location: [facility];  Service: Urology;  Laterality: Right;  . HOLMIUM LASER APPLICATION Left 08/15/2012   Procedure: HOLMIUM LASER APPLICATION;  Surgeon: Anner CreteJohn J  Wrenn, MD;  Location: [facility];  Service: Urology;  Laterality: Left;  . LITHOTRIPSY  15 yrs ago   several done  . LUMBAR LAMINECTOMY/DECOMPRESSION MICRODISCECTOMY Left 06/08/2016   Procedure: Laminectomy and Foraminotomy - Lumbar two -three - Lumbar three-four - Lumbar four-five , left;  Surgeon: Tia AlertJones, David S, MD;  [facility];  Service: Neurosurgery;  Laterality: Left;  . TONSILLECTOMY  age 89  . TRANSURETHRAL RESECTION OF PROSTATE N/A 09/02/2015   Procedure: TRANSURETHRAL RESECTION OF THE PROSTATE (TURP) AND PROSTATE BIOPSY;  Surgeon: Bjorn PippinJohn Wrenn, MD;  Location: [facility];  Service: Urology;  Laterality: N/A;    MEDICATIONS: No current facility-administered medications for this encounter.    . AMINO ACIDS COMPLEX PO  . amLODipine (NORVASC) 5 MG tablet  . Ascorbic Acid (VITAMIN C) 1000 MG tablet  . Biotin 5000 MCG TABS  . Calcium Carbonate-Simethicone (ROLAIDS MULTI-SYMPTOM PO)  . Coenzyme Q10 (CO Q 10) 100 MG CAPS  . fish oil-omega-3 fatty acids 1000 MG capsule  . fluticasone (FLONASE) 50 MCG/ACT nasal spray  . Garlic 1000 MG CAPS  . Ginkgo Biloba 60 MG CAPS  . Glucosamine Sulfate 1000 MG CAPS  . Hyaluronic Acid-Vitamin C (HYALURONIC ACID PO)  . LECITHIN PO  . lisinopril (PRINIVIL,ZESTRIL) 20 MG tablet  . meloxicam (MOBIC) 15 MG tablet  . metFORMIN (GLUCOPHAGE) 1000 MG tablet  . Methylsulfonylmethane (MSM) 1000 MG TABS  . Multiple Vitamin (MULTIVITAMIN WITH MINERALS) TABS  . niacin (SLO-NIACIN) 500 MG tablet  . ranitidine (ZANTAC) 300 MG tablet  . sildenafil (VIAGRA) 100 MG tablet  . Silica GEL  . simvastatin (ZOCOR) 40 MG tablet  . sotalol (BETAPACE) 120 MG tablet  . traMADol (ULTRAM) 50 MG tablet  . TURMERIC PO  . VANADYL SULFATE PO  . vitamin B-12 (CYANOCOBALAMIN) 1000 MCG tablet  . aspirin EC 81 MG tablet  . oxyCODONE-acetaminophen (PERCOCET/ROXICET) 5-325 MG tablet  . Psyllium (METAMUCIL FIBER PO)  . sildenafil (VIAGRA) 50 MG tablet  . tetrahydrozoline-zinc  (VISINE-AC) 0.05-0.25 % ophthalmic solution  ASA is currently on hold.   Velna Ochsllison Carmalita Wakefield, PA-C [facility]/Anesthesiology Phone [phone] 07/17/2017 11:37 AM

## 2017-07-25 ENCOUNTER — Other Ambulatory Visit: Payer: Self-pay

## 2017-07-25 ENCOUNTER — Encounter (HOSPITAL_COMMUNITY): Payer: Self-pay | Admitting: *Deleted

## 2017-07-25 NOTE — Progress Notes (Signed)
Pt denies any acute cardiopulmonary issues. Pt stated that he is under the care of Dr. Jeralene HuffKurt Daniels, [facility] " I only go to him once a year for blood pressure medicine." Pt denies having a stress test, echo and cardiac cath. Pt denies having a chest x ray within the last year. Pt denies recent labs. Pt made aware to stop taking Aspiring (unless advised otherwise by surgeon), Vitamins, fish oil, and herbal medications (Biotin, CO Q 10, Garlic, Ginko Biloba, Glucosamine, Lecithin, Hyaluronic Acid with Vitamin C, MSM, Silica Gel, Turmeric, Vanadyl Sulfate, Niacin).  Do not take any NSAIDs ie: Ibuprofen, Advil, Naproxen (Aleve), Motrin, Meloxicam (Mobic), BC and Goody Powder. Pt stated that he had taken some supplements today " I took CO Q 10 and some other stuff this morning, I didn't take all of my vitamins. " Pt stated that he had not stopped taking Meloxicam (Mobic) " I stopped the Tramadol because someone told me that Tramadol was a blood thinner and Mobic was not. " Nurse clarified with pt that Mobic is an NSAID and should have been stopped one week prior to surgery. Spoke with Erie NoeVanessa, Surgical Coordinator, to make surgeon aware that pt had not stopped all supplements or Mobic. Anesthesia also made aware that pt had not stopped taking all supplements or Mobic.

## 2017-07-25 NOTE — Progress Notes (Signed)
Pt made aware to not take Metformin on DOS. Pt stated that he does not check his blood glucose. Pt verbalized understanding of all pre-op instructions.

## 2017-07-26 ENCOUNTER — Inpatient Hospital Stay (HOSPITAL_COMMUNITY): Payer: PPO

## 2017-07-26 ENCOUNTER — Other Ambulatory Visit: Payer: Self-pay

## 2017-07-26 ENCOUNTER — Inpatient Hospital Stay (HOSPITAL_COMMUNITY): Payer: PPO | Admitting: Vascular Surgery

## 2017-07-26 ENCOUNTER — Inpatient Hospital Stay (HOSPITAL_COMMUNITY)
Admission: RE | Admit: 2017-07-26 | Discharge: 2017-07-28 | DRG: 455 | Disposition: A | Discharge status: Home Health | Payer: PPO | Attending: Neurological Surgery | Admitting: Neurological Surgery

## 2017-07-26 ENCOUNTER — Encounter (HOSPITAL_COMMUNITY): Payer: Self-pay | Admitting: Orthopedic Surgery

## 2017-07-26 ENCOUNTER — Encounter (HOSPITAL_COMMUNITY)
Admission: RE | Disposition: A | Discharge status: Home Health | Payer: Self-pay | Source: Home / Self Care | Attending: Neurological Surgery

## 2017-07-26 DIAGNOSIS — F17220 Nicotine dependence, chewing tobacco, uncomplicated: Secondary | ICD-10-CM | POA: Diagnosis not present

## 2017-07-26 DIAGNOSIS — M48061 Spinal stenosis, lumbar region without neurogenic claudication: Principal | ICD-10-CM | POA: Diagnosis present

## 2017-07-26 DIAGNOSIS — M4326 Fusion of spine, lumbar region: Secondary | ICD-10-CM | POA: Diagnosis not present

## 2017-07-26 DIAGNOSIS — Z91048 Other nonmedicinal substance allergy status: Secondary | ICD-10-CM

## 2017-07-26 DIAGNOSIS — M160 Bilateral primary osteoarthritis of hip: Secondary | ICD-10-CM | POA: Diagnosis not present

## 2017-07-26 DIAGNOSIS — Z79899 Other long term (current) drug therapy: Secondary | ICD-10-CM | POA: Diagnosis not present

## 2017-07-26 DIAGNOSIS — E119 Type 2 diabetes mellitus without complications: Secondary | ICD-10-CM | POA: Diagnosis not present

## 2017-07-26 DIAGNOSIS — Z888 Allergy status to other drugs, medicaments and biological substances status: Secondary | ICD-10-CM

## 2017-07-26 DIAGNOSIS — Z7984 Long term (current) use of oral hypoglycemic drugs: Secondary | ICD-10-CM

## 2017-07-26 DIAGNOSIS — H9193 Unspecified hearing loss, bilateral: Secondary | ICD-10-CM | POA: Diagnosis present

## 2017-07-26 DIAGNOSIS — M47816 Spondylosis without myelopathy or radiculopathy, lumbar region: Secondary | ICD-10-CM | POA: Diagnosis not present

## 2017-07-26 DIAGNOSIS — Z419 Encounter for procedure for purposes other than remedying health state, unspecified: Secondary | ICD-10-CM

## 2017-07-26 DIAGNOSIS — M5126 Other intervertebral disc displacement, lumbar region: Secondary | ICD-10-CM | POA: Diagnosis not present

## 2017-07-26 DIAGNOSIS — Z791 Long term (current) use of non-steroidal anti-inflammatories (NSAID): Secondary | ICD-10-CM | POA: Diagnosis not present

## 2017-07-26 DIAGNOSIS — J302 Other seasonal allergic rhinitis: Secondary | ICD-10-CM | POA: Diagnosis not present

## 2017-07-26 DIAGNOSIS — Z881 Allergy status to other antibiotic agents status: Secondary | ICD-10-CM

## 2017-07-26 DIAGNOSIS — Z880 Allergy status to penicillin: Secondary | ICD-10-CM

## 2017-07-26 DIAGNOSIS — Z7982 Long term (current) use of aspirin: Secondary | ICD-10-CM | POA: Diagnosis not present

## 2017-07-26 DIAGNOSIS — M1990 Unspecified osteoarthritis, unspecified site: Secondary | ICD-10-CM | POA: Diagnosis not present

## 2017-07-26 DIAGNOSIS — K219 Gastro-esophageal reflux disease without esophagitis: Secondary | ICD-10-CM | POA: Diagnosis present

## 2017-07-26 DIAGNOSIS — Z01818 Encounter for other preprocedural examination: Secondary | ICD-10-CM | POA: Diagnosis not present

## 2017-07-26 DIAGNOSIS — Z9079 Acquired absence of other genital organ(s): Secondary | ICD-10-CM

## 2017-07-26 DIAGNOSIS — Z8679 Personal history of other diseases of the circulatory system: Secondary | ICD-10-CM

## 2017-07-26 DIAGNOSIS — M5136 Other intervertebral disc degeneration, lumbar region: Secondary | ICD-10-CM | POA: Diagnosis not present

## 2017-07-26 DIAGNOSIS — Z981 Arthrodesis status: Secondary | ICD-10-CM

## 2017-07-26 DIAGNOSIS — I10 Essential (primary) hypertension: Secondary | ICD-10-CM | POA: Diagnosis not present

## 2017-07-26 DIAGNOSIS — Z87442 Personal history of urinary calculi: Secondary | ICD-10-CM

## 2017-07-26 DIAGNOSIS — M4726 Other spondylosis with radiculopathy, lumbar region: Secondary | ICD-10-CM | POA: Diagnosis not present

## 2017-07-26 HISTORY — DX: Gastro-esophageal reflux disease without esophagitis: K21.9

## 2017-07-26 HISTORY — DX: Presence of external hearing-aid: Z97.4

## 2017-07-26 HISTORY — DX: Spinal stenosis, lumbar region without neurogenic claudication: M48.061

## 2017-07-26 LAB — BASIC METABOLIC PANEL
Anion gap: 13 (ref 5–15)
BUN: 18 mg/dL (ref 8–23)
CALCIUM: 9.5 mg/dL (ref 8.9–10.3)
CHLORIDE: 106 mmol/L (ref 98–111)
CO2: 23 mmol/L (ref 22–32)
CREATININE: 1.12 mg/dL (ref 0.61–1.24)
Glucose, Bld: 150 mg/dL — ABNORMAL HIGH (ref 70–99)
Potassium: 4.4 mmol/L (ref 3.5–5.1)
SODIUM: 142 mmol/L (ref 135–145)

## 2017-07-26 LAB — CBC WITH DIFFERENTIAL/PLATELET
Abs Immature Granulocytes: 0 10*3/uL (ref 0.0–0.1)
BASOS PCT: 1 %
Basophils Absolute: 0 10*3/uL (ref 0.0–0.1)
EOS ABS: 0.1 10*3/uL (ref 0.0–0.7)
EOS PCT: 2 %
HEMATOCRIT: 40.7 % (ref 39.0–52.0)
Hemoglobin: 13.2 g/dL (ref 13.0–17.0)
Immature Granulocytes: 1 %
LYMPHS ABS: 1.3 10*3/uL (ref 0.7–4.0)
Lymphocytes Relative: 20 %
MCH: 31.1 pg (ref 26.0–34.0)
MCHC: 32.4 g/dL (ref 30.0–36.0)
MCV: 96 fL (ref 78.0–100.0)
Monocytes Absolute: 0.6 10*3/uL (ref 0.1–1.0)
Monocytes Relative: 10 %
Neutro Abs: 4.4 10*3/uL (ref 1.7–7.7)
Neutrophils Relative %: 68 %
PLATELETS: 220 10*3/uL (ref 150–400)
RBC: 4.24 MIL/uL (ref 4.22–5.81)
RDW: 12.6 % (ref 11.5–15.5)
WBC: 6.5 10*3/uL (ref 4.0–10.5)

## 2017-07-26 LAB — GLUCOSE, CAPILLARY
GLUCOSE-CAPILLARY: 90 mg/dL (ref 70–99)
Glucose-Capillary: 138 mg/dL — ABNORMAL HIGH (ref 70–99)
Glucose-Capillary: 97 mg/dL (ref 70–99)

## 2017-07-26 LAB — PROTIME-INR
INR: 1.04
Prothrombin Time: 13.5 seconds (ref 11.4–15.2)

## 2017-07-26 LAB — ABO/RH: ABO/RH(D): A POS

## 2017-07-26 LAB — TYPE AND SCREEN
ABO/RH(D): A POS
Antibody Screen: NEGATIVE

## 2017-07-26 LAB — HEMOGLOBIN A1C
Hgb A1c MFr Bld: 6.4 % — ABNORMAL HIGH (ref 4.8–5.6)
Mean Plasma Glucose: 136.98 mg/dL

## 2017-07-26 SURGERY — POSTERIOR LUMBAR FUSION 1 LEVEL
Anesthesia: General | Site: Spine Lumbar

## 2017-07-26 MED ORDER — BACITRACIN 50000 UNITS IM SOLR
INTRAMUSCULAR | Status: DC | PRN
  Administered 2017-07-26: 500 mL

## 2017-07-26 MED ORDER — ROCURONIUM BROMIDE 100 MG/10ML IV SOLN
INTRAVENOUS | Status: DC | PRN
  Administered 2017-07-26: 50 mg via INTRAVENOUS
  Administered 2017-07-26: 30 mg via INTRAVENOUS

## 2017-07-26 MED ORDER — METHOCARBAMOL 500 MG PO TABS
500.0000 mg | ORAL_TABLET | Freq: Four times a day (QID) | ORAL | Status: DC | PRN
  Administered 2017-07-27 – 2017-07-28 (×2): 500 mg via ORAL
  Filled 2017-07-26 (×2): qty 1

## 2017-07-26 MED ORDER — HEPARIN SODIUM (PORCINE) 1000 UNIT/ML IJ SOLN
INTRAMUSCULAR | Status: AC
  Filled 2017-07-26: qty 1

## 2017-07-26 MED ORDER — HEPARIN SODIUM (PORCINE) 1000 UNIT/ML IJ SOLN
INTRAMUSCULAR | Status: DC | PRN
  Administered 2017-07-26: 5000 [IU]

## 2017-07-26 MED ORDER — METHOCARBAMOL 1000 MG/10ML IJ SOLN
500.0000 mg | Freq: Four times a day (QID) | INTRAMUSCULAR | Status: DC | PRN
  Administered 2017-07-26: 500 mg via INTRAVENOUS
  Filled 2017-07-26 (×2): qty 5

## 2017-07-26 MED ORDER — ROCURONIUM BROMIDE 50 MG/5ML IV SOLN
INTRAVENOUS | Status: AC
  Filled 2017-07-26: qty 1

## 2017-07-26 MED ORDER — LIDOCAINE 2% (20 MG/ML) 5 ML SYRINGE
INTRAMUSCULAR | Status: AC
  Filled 2017-07-26: qty 5

## 2017-07-26 MED ORDER — VANCOMYCIN HCL 10 G IV SOLR
1250.0000 mg | Freq: Once | INTRAVENOUS | Status: AC
  Administered 2017-07-26: 1250 mg via INTRAVENOUS
  Filled 2017-07-26: qty 1250

## 2017-07-26 MED ORDER — FENTANYL CITRATE (PF) 100 MCG/2ML IJ SOLN
INTRAMUSCULAR | Status: DC | PRN
  Administered 2017-07-26: 100 ug via INTRAVENOUS
  Administered 2017-07-26 (×4): 50 ug via INTRAVENOUS

## 2017-07-26 MED ORDER — OXYCODONE HCL 5 MG PO TABS
5.0000 mg | ORAL_TABLET | Freq: Once | ORAL | Status: AC | PRN
  Administered 2017-07-26: 5 mg via ORAL

## 2017-07-26 MED ORDER — ONDANSETRON HCL 4 MG/2ML IJ SOLN
INTRAMUSCULAR | Status: DC | PRN
  Administered 2017-07-26: 4 mg via INTRAVENOUS

## 2017-07-26 MED ORDER — SOTALOL HCL 120 MG PO TABS
120.0000 mg | ORAL_TABLET | Freq: Two times a day (BID) | ORAL | Status: DC
  Administered 2017-07-26 – 2017-07-28 (×4): 120 mg via ORAL
  Filled 2017-07-26 (×5): qty 1

## 2017-07-26 MED ORDER — MENTHOL 3 MG MT LOZG: 1.0000 | LOZENGE | OROMUCOSAL | Status: DC | PRN

## 2017-07-26 MED ORDER — VANCOMYCIN HCL 1000 MG IV SOLR
INTRAVENOUS | Status: DC | PRN
  Administered 2017-07-26: 1000 mg via TOPICAL

## 2017-07-26 MED ORDER — OXYCODONE HCL 5 MG PO TABS
5.0000 mg | ORAL_TABLET | ORAL | Status: DC | PRN
  Administered 2017-07-27 – 2017-07-28 (×3): 5 mg via ORAL
  Filled 2017-07-26 (×3): qty 1

## 2017-07-26 MED ORDER — ACETAMINOPHEN 650 MG RE SUPP: 650.0000 mg | RECTAL | Status: DC | PRN

## 2017-07-26 MED ORDER — BUPIVACAINE HCL (PF) 0.25 % IJ SOLN
INTRAMUSCULAR | Status: AC
  Filled 2017-07-26: qty 30

## 2017-07-26 MED ORDER — VANCOMYCIN HCL 1000 MG IV SOLR
INTRAVENOUS | Status: AC
  Filled 2017-07-26: qty 1000

## 2017-07-26 MED ORDER — PHENYLEPHRINE HCL 10 MG/ML IJ SOLN
INTRAVENOUS | Status: DC | PRN
  Administered 2017-07-26: 25 ug/min via INTRAVENOUS

## 2017-07-26 MED ORDER — SUGAMMADEX SODIUM 200 MG/2ML IV SOLN
INTRAVENOUS | Status: DC | PRN
  Administered 2017-07-26 (×2): 100 mg via INTRAVENOUS

## 2017-07-26 MED ORDER — MSM 1000 MG PO TABS: 1000.0000 mg | ORAL_TABLET | Freq: Every day | ORAL | Status: DC

## 2017-07-26 MED ORDER — SODIUM CHLORIDE 0.9 % IV SOLN: 250.0000 mL | INTRAVENOUS | Status: DC

## 2017-07-26 MED ORDER — ONDANSETRON HCL 4 MG PO TABS: 4.0000 mg | ORAL_TABLET | Freq: Four times a day (QID) | ORAL | Status: DC | PRN

## 2017-07-26 MED ORDER — CHLORHEXIDINE GLUCONATE CLOTH 2 % EX PADS: 6.0000 | MEDICATED_PAD | Freq: Once | CUTANEOUS | Status: DC

## 2017-07-26 MED ORDER — POTASSIUM CHLORIDE IN NACL 20-0.9 MEQ/L-% IV SOLN
INTRAVENOUS | Status: DC
  Administered 2017-07-26: 21:00:00 via INTRAVENOUS
  Filled 2017-07-26: qty 1000

## 2017-07-26 MED ORDER — SUGAMMADEX SODIUM 200 MG/2ML IV SOLN
INTRAVENOUS | Status: AC
  Filled 2017-07-26: qty 2

## 2017-07-26 MED ORDER — OXYCODONE HCL 5 MG/5ML PO SOLN: 5.0000 mg | Freq: Once | ORAL | Status: AC | PRN

## 2017-07-26 MED ORDER — NON FORMULARY
Status: DC | PRN
  Administered 2017-07-26: 10 mL

## 2017-07-26 MED ORDER — THROMBIN 5000 UNITS EX SOLR
CUTANEOUS | Status: AC
  Filled 2017-07-26: qty 5000

## 2017-07-26 MED ORDER — HYDROMORPHONE HCL 1 MG/ML IJ SOLN: 0.5000 mg | INTRAMUSCULAR | Status: DC | PRN

## 2017-07-26 MED ORDER — ADULT MULTIVITAMIN W/MINERALS CH
1.0000 | ORAL_TABLET | Freq: Two times a day (BID) | ORAL | Status: DC
  Administered 2017-07-27 – 2017-07-28 (×3): 1 via ORAL
  Filled 2017-07-26 (×4): qty 1

## 2017-07-26 MED ORDER — PHENYLEPHRINE HCL 10 MG/ML IJ SOLN
INTRAMUSCULAR | Status: DC | PRN
  Administered 2017-07-26 (×2): 80 ug via INTRAVENOUS

## 2017-07-26 MED ORDER — CELECOXIB 200 MG PO CAPS
200.0000 mg | ORAL_CAPSULE | Freq: Two times a day (BID) | ORAL | Status: DC
  Administered 2017-07-26 – 2017-07-28 (×4): 200 mg via ORAL
  Filled 2017-07-26 (×4): qty 1

## 2017-07-26 MED ORDER — EPHEDRINE SULFATE 50 MG/ML IJ SOLN
INTRAMUSCULAR | Status: DC | PRN
  Administered 2017-07-26 (×3): 10 mg via INTRAVENOUS

## 2017-07-26 MED ORDER — 0.9 % SODIUM CHLORIDE (POUR BTL) OPTIME
TOPICAL | Status: DC | PRN
  Administered 2017-07-26: 1000 mL

## 2017-07-26 MED ORDER — ASPIRIN EC 81 MG PO TBEC
81.0000 mg | DELAYED_RELEASE_TABLET | Freq: Every day | ORAL | Status: DC
  Administered 2017-07-27 – 2017-07-28 (×2): 81 mg via ORAL
  Filled 2017-07-26 (×2): qty 1

## 2017-07-26 MED ORDER — ACETAMINOPHEN 10 MG/ML IV SOLN
1000.0000 mg | INTRAVENOUS | Status: AC
  Administered 2017-07-26: 1000 mg via INTRAVENOUS
  Filled 2017-07-26: qty 100

## 2017-07-26 MED ORDER — SODIUM CHLORIDE 0.9% FLUSH
3.0000 mL | Freq: Two times a day (BID) | INTRAVENOUS | Status: DC
  Administered 2017-07-27: 3 mL via INTRAVENOUS

## 2017-07-26 MED ORDER — ONDANSETRON HCL 4 MG/2ML IJ SOLN: 4.0000 mg | Freq: Four times a day (QID) | INTRAMUSCULAR | Status: DC | PRN

## 2017-07-26 MED ORDER — FAMOTIDINE 20 MG PO TABS
20.0000 mg | ORAL_TABLET | Freq: Two times a day (BID) | ORAL | Status: DC
  Administered 2017-07-26 – 2017-07-28 (×4): 20 mg via ORAL
  Filled 2017-07-26 (×4): qty 1

## 2017-07-26 MED ORDER — PROPOFOL 10 MG/ML IV BOLUS
INTRAVENOUS | Status: DC | PRN
  Administered 2017-07-26: 140 mg via INTRAVENOUS

## 2017-07-26 MED ORDER — LIDOCAINE HCL (CARDIAC) PF 100 MG/5ML IV SOSY
PREFILLED_SYRINGE | INTRAVENOUS | Status: DC | PRN
  Administered 2017-07-26: 100 mg via INTRAVENOUS

## 2017-07-26 MED ORDER — LACTATED RINGERS IV SOLN
INTRAVENOUS | Status: DC
  Administered 2017-07-26 (×3): via INTRAVENOUS

## 2017-07-26 MED ORDER — SODIUM CHLORIDE 0.9% FLUSH: 3.0000 mL | INTRAVENOUS | Status: DC | PRN

## 2017-07-26 MED ORDER — PROPOFOL 10 MG/ML IV BOLUS
INTRAVENOUS | Status: AC
  Filled 2017-07-26: qty 20

## 2017-07-26 MED ORDER — VANCOMYCIN HCL IN DEXTROSE 1-5 GM/200ML-% IV SOLN
1000.0000 mg | INTRAVENOUS | Status: AC
  Administered 2017-07-26: 1000 mg via INTRAVENOUS

## 2017-07-26 MED ORDER — FENTANYL CITRATE (PF) 100 MCG/2ML IJ SOLN
INTRAMUSCULAR | Status: AC
  Administered 2017-07-26: 50 ug via INTRAVENOUS
  Filled 2017-07-26: qty 2

## 2017-07-26 MED ORDER — FENTANYL CITRATE (PF) 100 MCG/2ML IJ SOLN
25.0000 ug | INTRAMUSCULAR | Status: DC | PRN
  Administered 2017-07-26 (×3): 50 ug via INTRAVENOUS

## 2017-07-26 MED ORDER — VANCOMYCIN HCL IN DEXTROSE 1-5 GM/200ML-% IV SOLN
INTRAVENOUS | Status: AC
  Administered 2017-07-26: 1000 mg via INTRAVENOUS
  Filled 2017-07-26: qty 200

## 2017-07-26 MED ORDER — ACETAMINOPHEN 325 MG PO TABS: 650.0000 mg | ORAL_TABLET | ORAL | Status: DC | PRN

## 2017-07-26 MED ORDER — ONDANSETRON HCL 4 MG/2ML IJ SOLN
INTRAMUSCULAR | Status: AC
  Filled 2017-07-26: qty 2

## 2017-07-26 MED ORDER — INSULIN ASPART 100 UNIT/ML ~~LOC~~ SOLN
0.0000 [IU] | Freq: Three times a day (TID) | SUBCUTANEOUS | Status: DC
  Administered 2017-07-28: 3 [IU] via SUBCUTANEOUS
  Administered 2017-07-28: 5 [IU] via SUBCUTANEOUS

## 2017-07-26 MED ORDER — PHENOL 1.4 % MT LIQD: 1.0000 | OROMUCOSAL | Status: DC | PRN

## 2017-07-26 MED ORDER — AMLODIPINE BESYLATE 5 MG PO TABS
5.0000 mg | ORAL_TABLET | Freq: Two times a day (BID) | ORAL | Status: DC
  Administered 2017-07-26 – 2017-07-28 (×4): 5 mg via ORAL
  Filled 2017-07-26 (×4): qty 1

## 2017-07-26 MED ORDER — FENTANYL CITRATE (PF) 250 MCG/5ML IJ SOLN
INTRAMUSCULAR | Status: AC
  Filled 2017-07-26: qty 5

## 2017-07-26 MED ORDER — FENTANYL CITRATE (PF) 100 MCG/2ML IJ SOLN
50.0000 ug | Freq: Once | INTRAMUSCULAR | Status: AC
  Administered 2017-07-26: 50 ug via INTRAVENOUS

## 2017-07-26 MED ORDER — OXYCODONE HCL 5 MG PO TABS
ORAL_TABLET | ORAL | Status: AC
  Administered 2017-07-27: 5 mg via ORAL
  Filled 2017-07-26: qty 1

## 2017-07-26 MED ORDER — LISINOPRIL 20 MG PO TABS
20.0000 mg | ORAL_TABLET | Freq: Every day | ORAL | Status: DC
  Administered 2017-07-26 – 2017-07-27 (×2): 20 mg via ORAL
  Filled 2017-07-26 (×2): qty 1

## 2017-07-26 MED ORDER — NIACIN ER 500 MG PO TBCR
500.0000 mg | EXTENDED_RELEASE_TABLET | Freq: Every day | ORAL | Status: DC
  Administered 2017-07-27: 500 mg via ORAL
  Filled 2017-07-26 (×3): qty 1

## 2017-07-26 MED ORDER — SENNA 8.6 MG PO TABS
1.0000 | ORAL_TABLET | Freq: Two times a day (BID) | ORAL | Status: DC
  Administered 2017-07-26 – 2017-07-28 (×4): 8.6 mg via ORAL
  Filled 2017-07-26 (×4): qty 1

## 2017-07-26 MED ORDER — BUPIVACAINE HCL (PF) 0.25 % IJ SOLN
INTRAMUSCULAR | Status: DC | PRN
  Administered 2017-07-26: 5 mL

## 2017-07-26 MED ORDER — GELATIN ABSORBABLE MT POWD
OROMUCOSAL | Status: DC | PRN
  Administered 2017-07-26: 5 mL via TOPICAL

## 2017-07-26 MED ORDER — ONDANSETRON HCL 4 MG/2ML IJ SOLN: 4.0000 mg | Freq: Once | INTRAMUSCULAR | Status: DC | PRN

## 2017-07-26 MED ORDER — THROMBIN 20000 UNITS EX SOLR
CUTANEOUS | Status: DC | PRN
  Administered 2017-07-26: 20 mL via TOPICAL

## 2017-07-26 MED ORDER — METFORMIN HCL 500 MG PO TABS
500.0000 mg | ORAL_TABLET | Freq: Two times a day (BID) | ORAL | Status: DC
  Administered 2017-07-27 – 2017-07-28 (×3): 500 mg via ORAL
  Filled 2017-07-26 (×4): qty 1

## 2017-07-26 SURGICAL SUPPLY — 76 items
ADH SKN CLS APL DERMABOND .7 (GAUZE/BANDAGES/DRESSINGS) ×1
APL SKNCLS STERI-STRIP NONHPOA (GAUZE/BANDAGES/DRESSINGS) ×1
BAG DECANTER FOR FLEXI CONT (MISCELLANEOUS) ×3 IMPLANT
BASKET BONE COLLECTION (BASKET) ×3 IMPLANT
BENZOIN TINCTURE PRP APPL 2/3 (GAUZE/BANDAGES/DRESSINGS) ×3 IMPLANT
BLADE CLIPPER SURG (BLADE) IMPLANT
BONE CANC CHIPS 40CC CAN1/2 (Bone Implant) ×3 IMPLANT
BUR MATCHSTICK NEURO 3.0 LAGG (BURR) ×3 IMPLANT
CANISTER SUCT 3000ML PPV (MISCELLANEOUS) ×3 IMPLANT
CARTRIDGE OIL MAESTRO DRILL (MISCELLANEOUS) ×1 IMPLANT
CHIPS CANC BONE 40CC CAN1/2 (Bone Implant) ×1 IMPLANT
CLOSURE WOUND 1/2 X4 (GAUZE/BANDAGES/DRESSINGS) ×2
CONT SPEC 4OZ CLIKSEAL STRL BL (MISCELLANEOUS) ×3 IMPLANT
COVER BACK TABLE 60X90IN (DRAPES) ×3 IMPLANT
DERMABOND ADVANCED (GAUZE/BANDAGES/DRESSINGS) ×2
DERMABOND ADVANCED .7 DNX12 (GAUZE/BANDAGES/DRESSINGS) ×1 IMPLANT
DIFFUSER DRILL AIR PNEUMATIC (MISCELLANEOUS) ×3 IMPLANT
DRAPE C-ARM 42X72 X-RAY (DRAPES) ×6 IMPLANT
DRAPE LAPAROTOMY 100X72X124 (DRAPES) ×3 IMPLANT
DRAPE LAPAROTOMY T 98X78 PEDS (DRAPES) IMPLANT
DRAPE POUCH INSTRU U-SHP 10X18 (DRAPES) ×1 IMPLANT
DRAPE SURG 17X23 STRL (DRAPES) ×3 IMPLANT
DRSG OPSITE POSTOP 4X8 (GAUZE/BANDAGES/DRESSINGS) ×2 IMPLANT
DURAPREP 26ML APPLICATOR (WOUND CARE) ×3 IMPLANT
ELECT REM PT RETURN 9FT ADLT (ELECTROSURGICAL) ×3
ELECTRODE REM PT RTRN 9FT ADLT (ELECTROSURGICAL) ×1 IMPLANT
EVACUATOR 1/8 PVC DRAIN (DRAIN) ×1 IMPLANT
GAUZE SPONGE 4X4 16PLY XRAY LF (GAUZE/BANDAGES/DRESSINGS) IMPLANT
GLOVE BIO SURGEON STRL SZ7 (GLOVE) ×4 IMPLANT
GLOVE BIO SURGEON STRL SZ8 (GLOVE) ×6 IMPLANT
GLOVE BIOGEL PI IND STRL 6.5 (GLOVE) IMPLANT
GLOVE BIOGEL PI IND STRL 7.0 (GLOVE) IMPLANT
GLOVE BIOGEL PI IND STRL 7.5 (GLOVE) IMPLANT
GLOVE BIOGEL PI INDICATOR 6.5 (GLOVE) ×2
GLOVE BIOGEL PI INDICATOR 7.0 (GLOVE) ×2
GLOVE BIOGEL PI INDICATOR 7.5 (GLOVE) ×4
GLOVE SURG SS PI 6.0 STRL IVOR (GLOVE) ×2 IMPLANT
GLOVE SURG SS PI 7.0 STRL IVOR (GLOVE) ×6 IMPLANT
GOWN STRL REUS W/ TWL LRG LVL3 (GOWN DISPOSABLE) IMPLANT
GOWN STRL REUS W/ TWL XL LVL3 (GOWN DISPOSABLE) ×2 IMPLANT
GOWN STRL REUS W/TWL 2XL LVL3 (GOWN DISPOSABLE) IMPLANT
GOWN STRL REUS W/TWL LRG LVL3 (GOWN DISPOSABLE) ×9
GOWN STRL REUS W/TWL XL LVL3 (GOWN DISPOSABLE) ×6
GRAFT BNE CHIP CANC 1-8 40 (Bone Implant) IMPLANT
HEMOSTAT POWDER KIT SURGIFOAM (HEMOSTASIS) ×2 IMPLANT
KIT BASIN OR (CUSTOM PROCEDURE TRAY) ×3 IMPLANT
KIT BONE MRW ASP ANGEL CPRP (KITS) ×2 IMPLANT
KIT TURNOVER KIT B (KITS) ×3 IMPLANT
MARKER SKIN DUAL TIP RULER LAB (MISCELLANEOUS) ×2 IMPLANT
MILL MEDIUM DISP (BLADE) ×3 IMPLANT
NDL HYPO 18GX1.5 BLUNT FILL (NEEDLE) IMPLANT
NDL HYPO 25X1 1.5 SAFETY (NEEDLE) ×1 IMPLANT
NEEDLE HYPO 18GX1.5 BLUNT FILL (NEEDLE) ×6 IMPLANT
NEEDLE HYPO 25X1 1.5 SAFETY (NEEDLE) ×3 IMPLANT
NS IRRIG 1000ML POUR BTL (IV SOLUTION) ×3 IMPLANT
OIL CARTRIDGE MAESTRO DRILL (MISCELLANEOUS) ×3
PACK LAMINECTOMY NEURO (CUSTOM PROCEDURE TRAY) ×3 IMPLANT
PAD ARMBOARD 7.5X6 YLW CONV (MISCELLANEOUS) ×13 IMPLANT
PUTTY DBM ALLOSYNC PURE 10CC (Putty) ×2 IMPLANT
ROD PC 5.5X50 TI ARSENAL (Rod) ×4 IMPLANT
SCREW CANC CBX 5.0X35 (Screw) ×4 IMPLANT
SCREW CBX 5.0X40MM (Screw) ×4 IMPLANT
SCREW SET SPINAL ARSENAL 47127 (Screw) ×8 IMPLANT
SPACER PEEK PS 25X9MM 9MM 5DEG (Spacer) ×4 IMPLANT
SPONGE LAP 4X18 RFD (DISPOSABLE) IMPLANT
SPONGE SURGIFOAM ABS GEL 100 (HEMOSTASIS) ×3 IMPLANT
STRIP CLOSURE SKIN 1/2X4 (GAUZE/BANDAGES/DRESSINGS) ×4 IMPLANT
SUT VIC AB 0 CT1 18XCR BRD8 (SUTURE) ×1 IMPLANT
SUT VIC AB 0 CT1 8-18 (SUTURE) ×6
SUT VIC AB 2-0 CP2 18 (SUTURE) ×3 IMPLANT
SUT VIC AB 3-0 SH 8-18 (SUTURE) ×6 IMPLANT
SYR CONTROL 10ML LL (SYRINGE) ×2 IMPLANT
TOWEL GREEN STERILE (TOWEL DISPOSABLE) ×3 IMPLANT
TOWEL GREEN STERILE FF (TOWEL DISPOSABLE) ×3 IMPLANT
TRAY FOLEY MTR SLVR 16FR STAT (SET/KITS/TRAYS/PACK) ×3 IMPLANT
WATER STERILE IRR 1000ML POUR (IV SOLUTION) ×3 IMPLANT

## 2017-07-26 NOTE — Anesthesia Preprocedure Evaluation (Addendum)
Anesthesia Evaluation  Patient identified by MRN, date of birth, ID band Patient awake    Reviewed: Allergy & Precautions, NPO status , Patient's Chart, lab work & pertinent test results, reviewed documented beta blocker date and time   Airway Mallampati: II  TM Distance: >3 FB Neck ROM: Full    Dental  (+) Dental Advisory Given, Chipped,    Pulmonary former smoker,    Pulmonary exam normal breath sounds clear to auscultation       Cardiovascular hypertension, Pt. on medications and Pt. on home beta blockers Normal cardiovascular exam+ dysrhythmias Atrial Fibrillation  Rhythm:Regular Rate:Normal  Hx pericarditis   Neuro/Psych Depression Lumbar stenosis, b/l hearing deficits    GI/Hepatic Neg liver ROS, GERD  Medicated and Controlled,  Endo/Other  diabetes, Type 2, Oral Hypoglycemic Agents  Renal/GU negative Renal ROS     Musculoskeletal  (+) Arthritis , Osteoarthritis,    Abdominal   Peds  Hematology negative hematology ROS (+)   Anesthesia Other Findings   Reproductive/Obstetrics                           Anesthesia Physical  Anesthesia Plan  ASA: III  Anesthesia Plan: General   Post-op Pain Management:    Induction: Intravenous  PONV Risk Score and Plan: 2 and Treatment may vary due to age or medical condition, Ondansetron and Dexamethasone  Airway Management Planned: Oral ETT  Additional Equipment: None  Intra-op Plan:   Post-operative Plan: Extubation in OR  Informed Consent: I have reviewed the patients History and Physical, chart, labs and discussed the procedure including the risks, benefits and alternatives for the proposed anesthesia with the patient or authorized representative who has indicated his/her understanding and acceptance.   Dental advisory given  Plan Discussed with: CRNA and Anesthesiologist  Anesthesia Plan Comments:        Anesthesia Quick  Evaluation

## 2017-07-26 NOTE — Anesthesia Procedure Notes (Signed)
Procedure Name: Intubation Date/Time: 07/26/2017 11:00 AM Performed by: Lovie Cholock, Arnola Crittendon K, CRNA Pre-anesthesia Checklist: Patient identified, Emergency Drugs available, Suction available and Patient being monitored Patient Re-evaluated:Patient Re-evaluated prior to induction Oxygen Delivery Method: Circle System Utilized Preoxygenation: Pre-oxygenation with 100% oxygen Induction Type: IV induction Ventilation: Mask ventilation without difficulty Laryngoscope Size: Miller and 2 Grade View: Grade I Tube type: Oral Tube size: 7.5 mm Number of attempts: 1 Airway Equipment and Method: Stylet and Oral airway Placement Confirmation: ETT inserted through vocal cords under direct vision,  positive ETCO2 and breath sounds checked- equal and bilateral Secured at: 23 cm Tube secured with: Tape Dental Injury: Teeth and Oropharynx as per pre-operative assessment

## 2017-07-26 NOTE — Op Note (Signed)
07/26/2017  3:19 PM  PATIENT:  Frank FallenKenneth A Filo  77 y.o. 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 Decompressive lumbar laminectomy L2-3 requiring more work than would be required for a simple exposure of the disk for PLIF in order to adequately decompress the neural elements and address the spinal stenosis 2. Posterior lumbar interbody fusion L2-3 using peek interbody cages packed with morcellized allograft and autograft soaked with bone marrow aspirate obtained through a separate fascial incision over the right iliac crest 3. Posterior fixation L2-3 using Alphatec cortical pedicle screws.  4. Intertransverse arthrodesis L2-L5 using morcellized autograft and allograft.   SURGEON:  Marikay Alaravid Ronal Maybury, MD  ASSISTANTS: Verlin DikeKimberly Meyran, FNP  ANESTHESIA:  General  EBL: 700 ml  Total I/O In: 1750 [I.V.:1750] Out: 1500 [Urine:800; Blood:700]  BLOOD ADMINISTERED:none  DRAINS: none   INDICATION FOR PROCEDURE: This patient presented with severe back and left hip and anterior leg pain starting 7 months after a three-level decompressive laminectomy.. Imaging revealed left-sided disc herniation L2-3 adjacent to the left L3 pedicle with multilevel degenerative disc disease and spondylosis.. The patient tried a reasonable attempt at conservative medical measures without relief. I recommended decompression and instrumented fusion to address the residual/recurrent stenosis and disc herniation as well as the segmental  instability.  Patient understood the risks, benefits, and alternatives and potential outcomes and wished to proceed.  PROCEDURE DETAILS:  The patient was brought to the operating room. After induction of generalized endotracheal anesthesia the patient was rolled into the  prone position on chest rolls and all pressure points were padded. The patient's lumbar region was cleaned and then prepped with DuraPrep and draped in the usual sterile fashion. Anesthesia was injected and then a dorsal midline incision was made and carried down to the lumbosacral fascia. The fascia was opened and the paraspinous musculature was taken down in a subperiosteal fashion to expose L2-3 L3-4 and L4-5. A self-retaining retractor was placed. Intraoperative fluoroscopy confirmed my level, and a dissected out over the facets to expose the transverse processes from L2-L5 bilaterally, and I started with placement of the L2 cortical pedicle screws. The pedicle screw entry zones were identified utilizing surface landmarks and  AP and lateral fluoroscopy. I scored the cortex with the high-speed drill and then used the hand drill to drill an upward and outward direction into the pedicle. I then tapped line to line. I then placed a 5.0 x 35 mm cortical pedicle screw into the pedicles of L2 bilaterally. I then dissected in a suprafascial plane over the right iliac crest, opened the fascia over the right iliac crest and used a Jamshidi needle to extract 60 mL of bone marrow aspirate from the right iliac crest which was then spun down for later arthrodesis. The hole was packed with Gelfoam, and The fascia was closed. I then turned my attention to the decompression and complete lumbar laminectomies, hemi- facetectomies, and foraminotomies were performed at L2-3 bilaterally. The patient had significant spinal stenosis and this required more work than would be required for a simple exposure of the disc for posterior lumbar interbody fusion which would only require a limited laminotomy. Much more generous decompression and generous foraminotomy was undertaken in order to adequately decompress the neural elements and address the patient's leg pain. The yellow ligament was removed to expose the underlying  dura and nerve  roots, and generous foraminotomies were performed to adequately decompress the neural elements. Both the exiting and traversing nerve roots were decompressed on both sides until a coronary dilator passed easily along the nerve roots. I found a moderate-sized left L2-3 herniated disc with an inferior free fragment compressing the left L3 nerve root at the pedicle level. This was removed with a nerve hook and pituitary rongeur. I felt this was the symptomatic lesion. The remainder of his imaging was considered old. I felt that he was symptomatically from a new disc herniation at L2-3 on the left. Therefore decided not to extend the hardware or the decompression distally to L3-4 and L4-5. I felt that at that point the risks and costs outweighed the potential benefits. Therefore I decided only to extend the intertransverse arthrodesis to the L5 level. Once the decompression was complete, I turned my attention to the posterior lower lumbar interbody fusion. The epidural venous vasculature was coagulated and cut sharply. Disc space was incised and the initial discectomy was performed with pituitary rongeurs. The disc space was distracted with sequential distractors to a height of 9 mm. We then used a series of scrapers and shavers to prepare the endplates for fusion. The midline was prepared with Epstein curettes. Once the complete discectomy was finished, we packed an appropriate sized interbody cage with local autograft and morcellized allograft, gently retracted the nerve root, and tapped the cage into position at L2-3.  The midline between the cages was packed with morselized autograft and allograft. We then turned our attention to the placement of the lower pedicle screws. The pedicle screw entry zones were identified utilizing surface landmarks and fluoroscopy. I drilled into each pedicle utilizing the hand drill, and tapped each pedicle with the appropriate tap. We palpated with a ball probe to assure no break in  the cortex. We then placed 5.0 x 40 mm into the pedicles bilaterally at L3. We then decorticated the transverse processes and laid a mixture of morcellized autograft and allograft out over these to perform intertransverse arthrodesis at L2-L5 bilaterally. We then placed lordotic rods into the multiaxial screw heads of the pedicle screws and locked these in position with the locking caps and anti-torque device. We then checked our construct with AP and lateral fluoroscopy. Irrigated with copious amounts of bacitracin-containing saline solution. Inspected the nerve roots once again to assure adequate decompression, lined to the dura with Gelfoam, placed powdered vancomycin into the wound, and closed the muscle and the fascia with 0 Vicryl. Closed the subcutaneous tissues with 2-0 Vicryl and subcuticular tissues with 3-0 Vicryl. The skin was closed with benzoin and Steri-Strips. Dressing was then applied, the patient was awakened from general anesthesia and transported to the recovery room in stable condition. At the end of the procedure all sponge, needle and instrument counts were correct.   PLAN OF CARE: admit to inpatient  PATIENT DISPOSITION:  PACU - hemodynamically stable.   Delay start of Pharmacological VTE agent (>24hrs) due to surgical blood loss or risk of bleeding:  yes

## 2017-07-26 NOTE — Transfer of Care (Signed)
Immediate Anesthesia Transfer of Care Note  Patient: Frank PriestlyKenneth A Milledge  Procedure(s) Performed: Re-do Laminectomy and Foraminotomy  - Lumbar Two-three;Lumbar Two-Three Posterior Lumbar Interbody Fusion,Posterior lateral Fusion Lumbar Two-Five. (N/A Spine Lumbar)  Patient Location: PACU  Anesthesia Type:General  Level of Consciousness: awake, oriented and patient cooperative  Airway & Oxygen Therapy: Patient Spontanous Breathing and Patient connected to face mask oxygen  Post-op Assessment: Report given to RN and Post -op Vital signs reviewed and stable  Post vital signs: Reviewed  Last Vitals:  Vitals Value Taken Time  BP 141/94 07/26/2017  3:31 PM  Temp    Pulse 67 07/26/2017  3:39 PM  Resp 16 07/26/2017  3:39 PM  SpO2 100 % 07/26/2017  3:39 PM  Vitals shown include unvalidated device data.  Last Pain:  Vitals:   07/26/17 1013  TempSrc:   PainSc: 8       Patients Stated Pain Goal: 4 (07/26/17 1013)  Complications: No apparent anesthesia complications

## 2017-07-26 NOTE — H&P (Signed)
Subjective: Patient is a 77 y.o. male admitted for back and L leg pain. Onset of symptoms was several months ago, rapidly worsening since that time.  The pain is rated intense, unremitting, and is located at the across the lower back and radiates to LLE. The pain is described as aching and occurs all day. The symptoms have been progressive. Symptoms are exacerbated by exercise. MRI or CT showed spondylosis, HNP, stenosis   Past Medical History:  Diagnosis Date  . Arthritis    osteoarthritis- hips  . Bladder stones   . Diabetes mellitus without complication (HCC)    no current meds  . Dysrhythmia    atrial flutter  . Endocarditis 2-3 yrs ago   12/2015 cardiology notes indicate pericarditis (not endocarditis)   . GERD (gastroesophageal reflux disease)   . Hearing aid worn    B/L  . History of kidney stones    multiple times  . Hx of seasonal allergies   . Hypertension   . Impaired hearing    no hearing aids  . Lumbar stenosis   . Rhinitis   . Snores    snores loudly on back    Past Surgical History:  Procedure Laterality Date  . BLADDER STONE REMOVAL  yrs ago  . cystolithopaxy  yrs ago  . CYSTOSCOPY  yrs agoseveral done  . CYSTOSCOPY WITH RETROGRADE PYELOGRAM, URETEROSCOPY AND STENT PLACEMENT Left 08/15/2012   Procedure: CYSTOSCOPY LITHIOPEXY, URETEROSCOPY STONE EXTRACTION WITH HOLMIUM LASER AND STENT PLACEMENT;  Surgeon: Anner Crete, MD;  Location: [facility];  Service: Urology;  Laterality: Left;  . CYSTOSCOPY WITH RETROGRADE PYELOGRAM, URETEROSCOPY AND STENT PLACEMENT Right 09/02/2015   Procedure: CYSTOSCOPY WITH RIGHT RETROGRADE/URETEROSCOPY, CYSTOLITHOPAXY;  Surgeon: Bjorn Pippin, MD;  Location: [facility];  Service: Urology;  Laterality: Right;  . HOLMIUM LASER APPLICATION Left 08/15/2012   Procedure: HOLMIUM LASER APPLICATION;  Surgeon: Anner Crete, MD;  Location: [facility];  Service: Urology;  Laterality: Left;  . LITHOTRIPSY  15 yrs ago   several done  . LUMBAR  LAMINECTOMY/DECOMPRESSION MICRODISCECTOMY Left 06/08/2016   Procedure: Laminectomy and Foraminotomy - Lumbar two -three - Lumbar three-four - Lumbar four-five , left;  Surgeon: Tia Alert, MD;  Location: [facility];  Service: Neurosurgery;  Laterality: Left;  . TONSILLECTOMY  age 81  . TRANSURETHRAL RESECTION OF PROSTATE N/A 09/02/2015   Procedure: TRANSURETHRAL RESECTION OF THE PROSTATE (TURP) AND PROSTATE BIOPSY;  Surgeon: Bjorn Pippin, MD;  Location: [facility];  Service: Urology;  Laterality: N/A;    Prior to Admission medications   Medication Sig Start Date End Date Taking? Authorizing Provider  AMINO ACIDS COMPLEX PO Take 1 tablet by mouth at bedtime. Multi Amino Acids   Yes [provider]  amLODipine (NORVASC) 5 MG tablet Take 5 mg by mouth 2 (two) times daily.    Yes [provider]  Ascorbic Acid (VITAMIN C) 1000 MG tablet Take 1,000 mg by mouth at bedtime.    Yes [provider]  Biotin 5000 MCG TABS Take 5,000 mcg by mouth daily.   Yes [provider]  Calcium Carbonate-Simethicone (ROLAIDS MULTI-SYMPTOM PO) Take 2 tablets by mouth every 6 (six) hours as needed (heartburn).    Yes [provider]  Coenzyme Q10 (CO Q 10) 100 MG CAPS Take 100 mg by mouth 2 (two) times daily.    Yes [provider]  fish oil-omega-3 fatty acids 1000 MG capsule Take 2 g by mouth 2 (two) times daily.   Yes [provider]  fluticasone (FLONASE) 50 MCG/ACT nasal spray Place 2 sprays into the nose daily as needed for allergies.    Yes [provider]  Garlic 1000 MG CAPS Take 1,000 mg by mouth 2 (two) times daily.   Yes [provider]  Ginkgo Biloba 60 MG CAPS Take 60 mg by mouth at bedtime.   Yes [provider]  Glucosamine Sulfate 1000 MG CAPS Take 1,000 mg by mouth 2 (two) times daily.   Yes [provider]  Hyaluronic Acid-Vitamin C (HYALURONIC ACID PO) Take 1 tablet by mouth daily.   Yes [provider]   LECITHIN PO Take 1 tablet by mouth 2 (two) times daily.   Yes [provider]  lisinopril (PRINIVIL,ZESTRIL) 20 MG tablet Take 20 mg by mouth at bedtime.    Yes [provider]  meloxicam (MOBIC) 15 MG tablet Take 15 mg by mouth daily.   Yes [provider]  metFORMIN (GLUCOPHAGE) 1000 MG tablet Take 500 mg by mouth 2 (two) times daily. 03/15/16  Yes [provider]  Methylsulfonylmethane (MSM) 1000 MG TABS Take 1,000 mg by mouth at bedtime.    Yes [provider]  Multiple Vitamin (MULTIVITAMIN WITH MINERALS) TABS Take 1 tablet by mouth 2 (two) times daily. Multivitamins for Seniors with no iron   Yes [provider]  niacin (SLO-NIACIN) 500 MG tablet Take 500 mg by mouth at bedtime.   Yes [provider]  oxyCODONE-acetaminophen (PERCOCET/ROXICET) 5-325 MG tablet Take 1 tablet by mouth every 6 (six) hours as needed for pain. 03/08/17  Yes [provider]  Psyllium (METAMUCIL FIBER PO) Take 1 each by mouth at bedtime. 2 tablespoons mixes up with cinnamon, vinegar, lemon juice   Yes [provider]  ranitidine (ZANTAC) 300 MG tablet Take 300 mg by mouth 2 (two) times daily. 04/02/16  Yes [provider]  sildenafil (VIAGRA) 100 MG tablet Take 100 mg by mouth daily as needed for erectile dysfunction.   Yes [provider]  sildenafil (VIAGRA) 50 MG tablet Take 50 mg by mouth daily as needed for erectile dysfunction.   Yes [provider]  Silica GEL Take 440 mg by mouth at bedtime. HORSE GELATIN SILICA GEL PILL   Yes [provider]  simvastatin (ZOCOR) 40 MG tablet Take 20 mg by mouth at bedtime. 04/20/16  Yes [provider]  sotalol (BETAPACE) 120 MG tablet Take 120 mg by mouth 2 (two) times daily.   Yes [provider]  tetrahydrozoline-zinc (VISINE-AC) 0.05-0.25 % ophthalmic solution Place 2 drops into both eyes 3 (three) times daily as needed (dry eyes).   Yes  [provider]  traMADol (ULTRAM) 50 MG tablet Take 50 mg by mouth 2 (two) times daily as needed (for pain.).  03/05/17  Yes [provider]  TURMERIC PO Take 800 mg by mouth at bedtime.   Yes [provider]  VANADYL SULFATE PO Take 10 mg by mouth daily.    Yes [provider]  vitamin B-12 (CYANOCOBALAMIN) 1000 MCG tablet Take 1,000 mcg by mouth at bedtime.    Yes [provider]  aspirin EC 81 MG tablet Take 81 mg by mouth daily.    [provider]   Allergies  Allergen Reactions  . Penicillins Rash and Other (See Comments)     PATIENT HAD A PCN REACTION WITH IMMEDIATE RASH, FACIAL/TONGUE/THROAT SWELLING, SOB, OR LIGHTHEADEDNESS WITH HYPOTENSION:  #  #  #  YES  #  #  #  Has patient had a PCN reaction causing severe rash involving mucus membranes or skin necrosis: unknown Has patient had a PCN reaction that required hospitalization no Has patient had a PCN reaction occurring within the last 10 years: no  . Ciprofloxacin Other (See Comments)    MUSCLE WEAKNESS  . Prednisone Other (See Comments)    DEPRESSION suicidial   . Adhesive [Tape] Rash    Social History   Tobacco Use  . Smoking status: Former Smoker    Packs/day: 1.00    Years: 15.00    Pack years: 15.00    Types: Cigarettes    Last attempt to quit: 01/30/1978    Years since quitting: 39.5  . Smokeless tobacco: Current User    Types: Chew, Snuff  . Tobacco comment: current use of smokeless tobacco  Substance Use Topics  . Alcohol use: No    History reviewed. No pertinent family history.   Review of Systems  Positive ROS: neg  All other systems have been reviewed and were otherwise negative with the exception of those mentioned in the HPI and as above.  Objective: Vital signs in last 24 hours: Temp:  [98.3 F (36.8 C)] 98.3 F (36.8 C) (06/27 0835) Pulse Rate:  [57] 57 (06/27 0835) Resp:  [20] 20 (06/27 0835) BP: (169)/(83) 169/83 (06/27 0835) SpO2:  [100  %] 100 % (06/27 0835) Weight:  [88.5 kg (195 lb)] 88.5 kg (195 lb) (06/27 0835)  General Appearance: Alert, cooperative, no distress, appears stated age Head: Normocephalic, without obvious abnormality, atraumatic Eyes: PERRL, conjunctiva/corneas clear, EOM's intact    Neck: Supple, symmetrical, trachea midline Back: Symmetric, no curvature, ROM normal, no CVA tenderness Lungs:  respirations unlabored Heart: Regular rate and rhythm Abdomen: Soft, non-tender Extremities: Extremities normal, atraumatic, no cyanosis or edema Pulses: 2+ and symmetric all extremities Skin: Skin color, texture, turgor normal, no rashes or lesions  NEUROLOGIC:   Mental status: Alert and oriented x4,  no aphasia, good attention span, fund of knowledge, and memory Motor Exam - grossly normal Sensory Exam - grossly normal Reflexes: 1+ Coordination - grossly normal Gait - grossly normal Balance - grossly normal Cranial Nerves: I: smell Not tested  II: visual acuity  OS: nl    OD: nl  II: visual fields Full to confrontation  II: pupils Equal, round, reactive to light  III,VII: ptosis None  III,IV,VI: extraocular muscles  Full ROM  V: mastication Normal  V: facial light touch sensation  Normal  V,VII: corneal reflex  Present  VII: facial muscle function - upper  Normal  VII: facial muscle function - lower Normal  VIII: hearing Not tested  IX: soft palate elevation  Normal  IX,X: gag reflex Present  XI: trapezius strength  5/5  XI: sternocleidomastoid strength 5/5  XI: neck flexion strength  5/5  XII: tongue strength  Normal    Data Review Lab Results  Component Value Date   WBC 6.5 07/26/2017   HGB 13.2 07/26/2017   HCT 40.7 07/26/2017   MCV 96.0 07/26/2017   PLT 220 07/26/2017   Lab Results  Component Value Date   NA 142 07/26/2017   K 4.4 07/26/2017   CL 106 07/26/2017   CO2 23 07/26/2017   BUN 18 07/26/2017   CREATININE 1.12 07/26/2017   GLUCOSE 150 (H) 07/26/2017   Lab Results   Component Value Date   INR 1.04 07/26/2017    Assessment/Plan:  Estimated body mass index is 28.8 kg/m as calculated from the following:  Height as of this encounter: 5\' 9"  (1.753 m).   Weight as of this encounter: 88.5 kg (195 lb). Patient admitted for LL/ fusion L2-5. Patient has failed a reasonable attempt at conservative therapy.  I explained the condition and procedure to the patient and answered any questions.  Patient wishes to proceed with procedure as planned. Understands risks/ benefits and typical outcomes of procedure.   JONES,DAVID S 07/26/2017 10:05 AM

## 2017-07-27 ENCOUNTER — Other Ambulatory Visit: Payer: Self-pay

## 2017-07-27 LAB — MRSA PCR SCREENING: MRSA BY PCR: NEGATIVE

## 2017-07-27 LAB — GLUCOSE, CAPILLARY
GLUCOSE-CAPILLARY: 130 mg/dL — AB (ref 70–99)
Glucose-Capillary: 158 mg/dL — ABNORMAL HIGH (ref 70–99)
Glucose-Capillary: 168 mg/dL — ABNORMAL HIGH (ref 70–99)
Glucose-Capillary: 180 mg/dL — ABNORMAL HIGH (ref 70–99)

## 2017-07-27 MED FILL — Anticoagulant Citrate Dextrose Solution A: Qty: 500 | Status: AC

## 2017-07-27 NOTE — Evaluation (Signed)
Physical Therapy Evaluation Patient Details Name: Frank Hill MRN: [id number] DOB: 27-Jul-1940 Today's Date: 07/27/2017   History of Present Illness  Patient is a 77 y/o male admitted for decompressive laminectomy L2-3 with PLIF.  PMH positive for arthritis, DM, GERD, HTN, lumbar lamy 5/18 and TURP.  Clinical Impression  Patient presents with decreased mobility following back surgery.  Currently min A to S level.  Will have assist at home first couple of days.  Feel he could return home without PT follow up, but may benefit from one more day of practice under inpatient supervision.  Will follow up if not d/c.     Follow Up Recommendations No PT follow up    Equipment Recommendations  None recommended by PT    Recommendations for Other Services       Precautions / Restrictions Precautions Precautions: Fall;Back Precaution Booklet Issued: Yes (comment) Required Braces or Orthoses: Spinal Brace Spinal Brace: Lumbar corset;Applied in sitting position Restrictions Weight Bearing Restrictions: Yes      Mobility  Bed Mobility Overal bed mobility: Needs Assistance Bed Mobility: Rolling;Sidelying to Sit Rolling: Min assist Sidelying to sit: Min assist       General bed mobility comments: cues for technique, assist for spinal stability  Transfers Overall transfer level: Needs assistance Equipment used: Rolling walker (2 wheeled) Transfers: Sit to/from Stand Sit to Stand: Supervision         General transfer comment: cues for posture, safety  Ambulation/Gait Ambulation/Gait assistance: Supervision Gait Distance (Feet): 60 Feet(in room with RW, then after brace 200' in hallway) Assistive device: Rolling walker (2 wheeled) Gait Pattern/deviations: Step-through pattern;Decreased stride length;Trunk flexed     General Gait Details: cues for posture  Stairs            Wheelchair Mobility    Modified Rankin (Stroke Patients Only)       Balance Overall  balance assessment: No apparent balance deficits (not formally assessed)                                           Pertinent Vitals/Pain Pain Assessment: Faces Faces Pain Scale: Hurts little more Pain Location: incisional Pain Descriptors / Indicators: Aching;Grimacing;Operative site guarding Pain Intervention(s): Monitored during session;Repositioned;Ice applied    Home Living Family/patient expects to be discharged to:: Private residence Living Arrangements: Alone Available Help at Discharge: Family Type of Home: House     Entrance Ferguson of Steps: 1 Home Layout: One level Home Equipment: Environmental consultant - 2 wheels;Walker - 4 wheels;Wheelchair - manual Additional Comments: daughters to stay for couple of days    Prior Function Level of Independence: Independent               Hand Dominance   Dominant Hand: Right    Extremity/Trunk Assessment   Upper Extremity Assessment Upper Extremity Assessment: Overall WFL for tasks assessed    Lower Extremity Assessment Lower Extremity Assessment: RLE deficits/detail;LLE deficits/detail RLE Deficits / Details: grossly WFL, but painful with hip flexion  RLE Sensation: history of peripheral neuropathy LLE Deficits / Details: grossly WFL, but painful with hip flexion LLE Sensation: history of peripheral neuropathy       Communication   Communication: HOH  Cognition Arousal/Alertness: Awake/alert Behavior During Therapy: WFL for tasks assessed/performed Overall Cognitive Status: Within Functional Limits for tasks assessed  General Comments General comments (skin integrity, edema, etc.): educated about brace, reviewed all precautions with handout    Exercises     Assessment/Plan    PT Assessment Patient needs continued PT services  PT Problem List Decreased mobility;Decreased knowledge of use of DME;Decreased knowledge of precautions;Decreased  safety awareness       PT Treatment Interventions DME instruction;Functional mobility training;Patient/family education;Therapeutic activities;Gait training    PT Goals (Current goals can be found in the Care Plan section)  Acute Rehab PT Goals Patient Stated Goal: to go home PT Goal Formulation: With patient/family Time For Goal Achievement: 07/29/17 Potential to Achieve Goals: Good    Frequency Min 5X/week   Barriers to discharge        Co-evaluation               AM-PAC PT "6 Clicks" Daily Activity  Outcome Measure Difficulty turning over in bed (including adjusting bedclothes, sheets and blankets)?: A Lot Difficulty moving from lying on back to sitting on the side of the bed? : Unable Difficulty sitting down on and standing up from a chair with arms (e.g., wheelchair, bedside commode, etc,.)?: A Little Help needed moving to and from a bed to chair (including a wheelchair)?: A Little Help needed walking in hospital room?: A Little Help needed climbing 3-5 steps with a railing? : A Lot 6 Click Score: 14    End of Session Equipment Utilized During Treatment: Back brace Activity Tolerance: Patient tolerated treatment well Patient left: with call bell/phone within reach;in chair;with family/visitor present   PT Visit Diagnosis: Difficulty in walking, not elsewhere classified (R26.2)    Time: 1610-96040950-1023 PT Time Calculation (min) (ACUTE ONLY): 33 min   Charges:   PT Evaluation $PT Eval Moderate Complexity: 1 Mod PT Treatments $Gait Training: 8-22 mins   PT G CodesSheran Lawless:        Cyndi Wynn, [place]PT [phone] 07/27/2017   Elray Mcgregorynthia Wynn 07/27/2017, 10:37 AM

## 2017-07-27 NOTE — Progress Notes (Signed)
Subjective: Patient reports back soreness, no leg pain or NTW  Objective: Vital signs in last 24 hours: Temp:  [97.8 F (36.6 C)-99.8 F (37.7 C)] 99.1 F (37.3 C) (06/28 0400) Pulse Rate:  [55-80] 78 (06/27 2329) Resp:  [7-20] 14 (06/27 1947) BP: (93-169)/(53-83) 93/73 (06/27 2329) SpO2:  [94 %-100 %] 98 % (06/27 2329) Weight:  [88.5 kg (195 lb)] 88.5 kg (195 lb) (06/27 0835)  Intake/Output from previous day: 06/27 0701 - 06/28 0700 In: 2160.6 [I.V.:2105.6; IV Piggyback:55] Out: 3175 [Urine:2475; Blood:700] Intake/Output this shift: No intake/output data recorded.  Neurologic: Grossly normal to in bed exam  Lab Results: Lab Results  Component Value Date   WBC 6.5 07/26/2017   HGB 13.2 07/26/2017   HCT 40.7 07/26/2017   MCV 96.0 07/26/2017   PLT 220 07/26/2017   Lab Results  Component Value Date   INR 1.04 07/26/2017   BMET Lab Results  Component Value Date   NA 142 07/26/2017   K 4.4 07/26/2017   CL 106 07/26/2017   CO2 23 07/26/2017   GLUCOSE 150 (H) 07/26/2017   BUN 18 07/26/2017   CREATININE 1.12 07/26/2017   CALCIUM 9.5 07/26/2017    Studies/Results: Chest 2 View  Result Date: 07/26/2017 CLINICAL DATA:  Preoperative evaluation for upcoming lumbar surgery EXAM: CHEST - 2 VIEW COMPARISON:  05/31/2016 FINDINGS: Cardiac shadow is within normal limits. The lungs are well aerated bilaterally. No focal infiltrate or sizable effusion is seen. Chronic blunting of the right costophrenic angle is noted. Calcified splenic cyst is noted and stable. No acute bony abnormality is seen. IMPRESSION: No acute abnormality noted.  Chronic changes as described. Electronically Signed   By: Alcide Clever M.D.   On: 07/26/2017 10:05   Dg Lumbar Spine 2-3 Views  Result Date: 07/26/2017 CLINICAL DATA:  L2-3 PLIF EXAM: LUMBAR SPINE - 2-3 VIEW; DG C-ARM 61-120 MIN COMPARISON:  06/11/2017 FLUOROSCOPY TIME:  Fluoroscopy Time:  1 minutes 2 seconds Radiation Exposure Index (if provided by  the fluoroscopic device): Not available Number of Acquired Spot Images: 2 FINDINGS: Images show pedicle screws at L2 and L3 with interbody fusion and posterior fixation. IMPRESSION: L2-3 fusion. Electronically Signed   By: Alcide Clever M.D.   On: 07/26/2017 15:31   Dg C-arm 1-60 Min  Result Date: 07/26/2017 CLINICAL DATA:  L2-3 PLIF EXAM: LUMBAR SPINE - 2-3 VIEW; DG C-ARM 61-120 MIN COMPARISON:  06/11/2017 FLUOROSCOPY TIME:  Fluoroscopy Time:  1 minutes 2 seconds Radiation Exposure Index (if provided by the fluoroscopic device): Not available Number of Acquired Spot Images: 2 FINDINGS: Images show pedicle screws at L2 and L3 with interbody fusion and posterior fixation. IMPRESSION: L2-3 fusion. Electronically Signed   By: Alcide Clever M.D.   On: 07/26/2017 15:31   Dg C-arm 1-60 Min  Result Date: 07/26/2017 CLINICAL DATA:  L2-3 PLIF EXAM: LUMBAR SPINE - 2-3 VIEW; DG C-ARM 61-120 MIN COMPARISON:  06/11/2017 FLUOROSCOPY TIME:  Fluoroscopy Time:  1 minutes 2 seconds Radiation Exposure Index (if provided by the fluoroscopic device): Not available Number of Acquired Spot Images: 2 FINDINGS: Images show pedicle screws at L2 and L3 with interbody fusion and posterior fixation. IMPRESSION: L2-3 fusion. Electronically Signed   By: Alcide Clever M.D.   On: 07/26/2017 15:31    Assessment/Plan: Doing well, but moving slow, D/C maybe later today or tomorrow  Estimated body mass index is 28.8 kg/m as calculated from the following:   Height as of this encounter: 5\' 9"  (1.753 m).  Weight as of this encounter: 88.5 kg (195 lb).    LOS: 1 day    Deveney Bayon S 07/27/2017, 7:45 AM

## 2017-07-27 NOTE — Anesthesia Postprocedure Evaluation (Signed)
Anesthesia Post Note  Patient: Darlin PriestlyKenneth A Bendall  Procedure(s) Performed: Re-do Laminectomy and Foraminotomy  - Lumbar Two-three;Lumbar Two-Three Posterior Lumbar Interbody Fusion,Posterior lateral Fusion Lumbar Two-Five. (N/A Spine Lumbar)     Patient location during evaluation: PACU Anesthesia Type: General Level of consciousness: awake and alert Pain management: pain level controlled Vital Signs Assessment: post-procedure vital signs reviewed and stable Respiratory status: spontaneous breathing, nonlabored ventilation, respiratory function stable and patient connected to nasal cannula oxygen Cardiovascular status: blood pressure returned to baseline and stable Postop Assessment: no apparent nausea or vomiting Anesthetic complications: no    Last Vitals:  Vitals:   07/27/17 0700 07/27/17 1148  BP: 139/69 134/68  Pulse: 72 76  Resp:    Temp: 37.2 C 37.3 C  SpO2: 99% 100%    Last Pain:  Vitals:   07/27/17 1148  TempSrc: Oral  PainSc:                  Shelton SilvasKevin D Rami Waddle

## 2017-07-27 NOTE — Progress Notes (Signed)
Orthopedic Tech Progress Note Patient Details:  Darlin PriestlyKenneth A Ashenfelter 02/04/1940 [id number]  Patient ID: Darlin PriestlyKenneth A Seyler, male   DOB: 02/28/1940, 77 y.o.   MRN: [id number]   Nikki DomCrawford, Vara Mairena 07/27/2017, 9:30 AM Called in bio-tech brace order; spoke with Wylene MenLacey

## 2017-07-27 NOTE — Progress Notes (Signed)
Occupational Therapy Evaluation Patient Details Name: Frank Hill MRN: [id number] DOB: 1941-01-18 Today's Date: 07/27/2017    History of Present Illness Patient is a 77 y/o male admitted for decompressive laminectomy L2-3 with PLIF.  PMH significant for but not limited to for arthritis, DM, GERD, HTN, lumbar lamy 5/18 and TURP.   Clinical Impression   PTA patient able to complete ADL with independence given increased, required assistance with IADLs and completed mobility with independence.  He currently requires supervision with UB ADL, minA with LB ADL, supervision for toilet transfers using 3:1 over toilet with RW, and supervision for toileting.  Patient educated on precautions, brace mgmt and wear schedule, compensatory techniques for ADL completion following precautions, transfer safety, energy conservation and safety.  Patient will have support from his daughters at discharge to provide assistance for the first couple days, as well as intermittently for IADL assistance.  Recommend continued OT services while admitted in order to ensure understanding of precautions, compensatory techniques, and maximizing independence with ADLs prior to dc home.      Follow Up Recommendations  No OT follow up;Supervision - Intermittent(would benefit from aide for IADLs)    Equipment Recommendations  3 in 1 bedside commode    Recommendations for Other Services       Precautions / Restrictions Precautions Precautions: Fall;Back Precaution Booklet Issued: No(issued in prior PT session ) Precaution Comments: reviewed thoroughly, able to recall 3/3 independently  Required Braces or Orthoses: Spinal Brace Spinal Brace: Lumbar corset;Applied in sitting position Restrictions Weight Bearing Restrictions: No      Mobility Bed Mobility Overal bed mobility: Needs Assistance             General bed mobility comments: seated in recliner upon entry  Transfers Overall transfer level: Needs  assistance Equipment used: Rolling walker (2 wheeled) Transfers: Sit to/from Stand Sit to Stand: Supervision         General transfer comment: cues for posture, safety, adherance to precautions    Balance Overall balance assessment: No apparent balance deficits (not formally assessed)                                         ADL either performed or assessed with clinical judgement   ADL Overall ADL's : Needs assistance/impaired Eating/Feeding: Modified independent;Sitting   Grooming: Supervision/safety;Standing;Cueing for compensatory techniques   Upper Body Bathing: Supervision/ safety;Sitting   Lower Body Bathing: Min guard;Sitting/lateral leans;Sit to/from stand;Cueing for back precautions;Cueing for compensatory techniques Lower Body Bathing Details (indicate cue type and reason): increased effort to reach B feet, requires cueing to adhere to precautions  Upper Body Dressing : Supervision/safety;Sitting   Lower Body Dressing: Minimal assistance;Cueing for compensatory techniques;Cueing for back precautions;Sit to/from stand Lower Body Dressing Details (indicate cue type and reason): would benefit from LB AE training, able to complete figure 4 method but greatly increased time   Toilet Transfer: Supervision/safety;Cueing for safety;Cueing for sequencing;Ambulation;RW;BSC Toilet Transfer Details (indicate cue type and reason): edu on use of 3:1 for safety at dc, demonstrates good technique with transfer and precautions  Toileting- Clothing Manipulation and Hygiene: Supervision/safety;Cueing for back precautions;Sit to/from Nurse, children's Details (indicate cue type and reason): needs to be assessed, patient reports plan to complete basin baths Functional mobility during ADLs: Supervision/safety;Rolling walker General ADL Comments: patient requires increased time for all tasks, redirection required to attend to tasks; initated  education with  compensatory techniques for back precautions      Vision   Vision Assessment?: No apparent visual deficits     Perception     Praxis      Pertinent Vitals/Pain Pain Assessment: Faces Faces Pain Scale: Hurts little more Pain Location: incisional Pain Descriptors / Indicators: Aching;Grimacing;Operative site guarding Pain Intervention(s): Limited activity within patient's tolerance;Monitored during session     Hand Dominance Right   Extremity/Trunk Assessment Upper Extremity Assessment Upper Extremity Assessment: Overall WFL for tasks assessed   Lower Extremity Assessment Lower Extremity Assessment: Defer to PT evaluation   Cervical / Trunk Assessment Cervical / Trunk Assessment: Other exceptions(s/p lumbar surgery)   Communication Communication Communication: HOH   Cognition Arousal/Alertness: Awake/alert Behavior During Therapy: WFL for tasks assessed/performed Overall Cognitive Status: Within Functional Limits for tasks assessed                                 General Comments: repeats questions, interally distracted, anxious for dc    General Comments  daughters present during session     Exercises     Shoulder Instructions      Home Living Family/patient expects to be discharged to:: Private residence Living Arrangements: Alone Available Help at Discharge: Family Type of Home: House   Entrance DexterStairs-Number of Steps: 1   Home Layout: One level     Bathroom Shower/Tub: Chief Strategy OfficerTub/shower unit   Bathroom Toilet: Standard     Home Equipment: Environmental consultantWalker - 2 wheels;Walker - 4 wheels;Wheelchair - manual   Additional Comments: daughters plan to stay for a couple of days, will assist intermittently after that       Prior Functioning/Environment Level of Independence: Independent        Comments: patient was independent with ADL given increased time, required assist with IADL        OT Problem List: Decreased activity tolerance;Decreased  safety awareness;Decreased knowledge of use of DME or AE;Decreased knowledge of precautions      OT Treatment/Interventions: Self-care/ADL training;DME and/or AE instruction;Patient/family education;Therapeutic activities    OT Goals(Current goals can be found in the care plan section) Acute Rehab OT Goals Patient Stated Goal: to go home OT Goal Formulation: With patient/family Time For Goal Achievement: 08/10/17 Potential to Achieve Goals: Good  OT Frequency: Min 2X/week   Barriers to D/C:            Co-evaluation              AM-PAC PT "6 Clicks" Daily Activity     Outcome Measure Help from another person eating meals?: None Help from another person taking care of personal grooming?: A Little Help from another person toileting, which includes using toliet, bedpan, or urinal?: A Little Help from another person bathing (including washing, rinsing, drying)?: A Little Help from another person to put on and taking off regular upper body clothing?: None Help from another person to put on and taking off regular lower body clothing?: A Little 6 Click Score: 20   End of Session Equipment Utilized During Treatment: Gait belt;Rolling walker;Back brace Nurse Communication: Mobility status  Activity Tolerance: Patient tolerated treatment well Patient left: in chair;with call bell/phone within reach;with family/visitor present  OT Visit Diagnosis: Other abnormalities of gait and mobility (R26.89)                Time: 4540-98111353-1427 OT Time Calculation (min): 34 min Charges:  OT General Charges $OT  Visit: 1 Visit OT Evaluation $OT Eval Moderate Complexity: 1 Mod OT Treatments $Self Care/Home Management : 8-22 mins G-Codes:     Chancy Milroy, OTR/L  Pager [phone]   Chancy Milroy 07/27/2017, 3:42 PM

## 2017-07-28 LAB — GLUCOSE, CAPILLARY: Glucose-Capillary: 245 mg/dL — ABNORMAL HIGH (ref 70–99)

## 2017-07-28 MED ORDER — CELECOXIB 200 MG PO CAPS: 200.0000 mg | ORAL_CAPSULE | Freq: Two times a day (BID) | ORAL | 0 refills | Status: DC

## 2017-07-28 MED ORDER — OXYCODONE HCL 5 MG PO TABS: 5.0000 mg | ORAL_TABLET | ORAL | 0 refills | Status: AC | PRN

## 2017-07-28 NOTE — Progress Notes (Signed)
Physical Therapy Treatment Patient Details Name: Frank Hill MRN: [id number] DOB: [birth year] Today's Date: 07/28/2017    History of Present Illness Patient is a 77 y/o male admitted for decompressive laminectomy L2-3 with PLIF.  PMH significant for but not limited to for arthritis, DM, GERD, HTN, lumbar lamy 5/18 and TURP.    PT Comments    Patient making continued progress towards PT goals but remains reluctant about safety at home. Given increased anxiety, patient requests HHPT and I agree with the benefits of HHPT for safety assessment in the home. WIll continue to see and progress as tolerated.  Follow Up Recommendations  Home health PT;Supervision - Intermittent     Equipment Recommendations  None recommended by PT    Recommendations for Other Services       Precautions / Restrictions Precautions Precautions: Fall;Back Precaution Booklet Issued: Yes (comment) Precaution Comments: pt needing moderate cues to state, reinforced with mobility, ADL and IADL Required Braces or Orthoses: Spinal Brace Spinal Brace: Lumbar corset;Applied in sitting position    Mobility  Bed Mobility Overal bed mobility: Needs Assistance Bed Mobility: Rolling;Sidelying to Sit Rolling: Min guard Sidelying to sit: Min guard       General bed mobility comments: received in chair  Transfers Overall transfer level: Needs assistance Equipment used: Rolling walker (2 wheeled);None Transfers: Sit to/from Stand Sit to Stand: Supervision         General transfer comment: VCs for positioning increased time to perfrom  Ambulation/Gait Ambulation/Gait assistance: Supervision Gait Distance (Feet): 190 Feet Assistive device: None Gait Pattern/deviations: Step-through pattern;Decreased stride length;Trunk flexed Gait velocity: modest instability but overall no overt LOB Gait velocity interpretation: <1.8 ft/sec, indicate of risk for recurrent falls General Gait Details: cues for upright  posture and increased cadence   Stairs             Wheelchair Mobility    Modified Rankin (Stroke Patients Only)       Balance Overall balance assessment: Mild deficits observed, not formally tested                                          Cognition Arousal/Alertness: Awake/alert Behavior During Therapy: Anxious Overall Cognitive Status: Within Functional Limits for tasks assessed                                        Exercises      General Comments        Pertinent Vitals/Pain Pain Assessment: Faces Faces Pain Scale: Hurts little more Pain Location: incisional Pain Descriptors / Indicators: Aching;Grimacing;Operative site guarding Pain Intervention(s): Monitored during session;Premedicated before session;Repositioned    Home Living                      Prior Function            PT Goals (current goals can now be found in the care plan section) Acute Rehab PT Goals Patient Stated Goal: to go home PT Goal Formulation: With patient/family Time For Goal Achievement: 07/29/17 Potential to Achieve Goals: Good Progress towards PT goals: Progressing toward goals    Frequency    Min 5X/week      PT Plan Discharge plan needs to be updated    Co-evaluation  AM-PAC PT "6 Clicks" Daily Activity  Outcome Measure  Difficulty turning over in bed (including adjusting bedclothes, sheets and blankets)?: A Little Difficulty moving from lying on back to sitting on the side of the bed? : A Little Difficulty sitting down on and standing up from a chair with arms (e.g., wheelchair, bedside commode, etc,.)?: A Little Help needed moving to and from a bed to chair (including a wheelchair)?: A Little Help needed walking in hospital room?: A Little Help needed climbing 3-5 steps with a railing? : A Lot 6 Click Score: 17    End of Session Equipment Utilized During Treatment: Back brace Activity  Tolerance: Patient tolerated treatment well Patient left: with call bell/phone within reach;in chair;with family/visitor present Nurse Communication: Mobility status PT Visit Diagnosis: Difficulty in walking, not elsewhere classified (R26.2)     Time: [id number] PT Time Calculation (min) (ACUTE ONLY): 16 min  Charges:  $Gait Training: 8-22 mins                    G Codes:       Charlotte Crumbevon Nickoles Gregori, PT DPT  Board Certified Neurologic Specialist [phone]    Fabio AsaDevon J Noah Pelaez 07/28/2017, 11:32 AM

## 2017-07-28 NOTE — Progress Notes (Signed)
Overall patient doing fairly well.  Pain still significant and limiting mobility.  Patient states that when he is discharged home he will need to be able to care for himself with minimal assistance from family.  Afebrile.  Vital signs are stable.  Dressing clean and dry.  Motor and sensory exam stable.  Progressing reasonably well following lumbar decompression and fusion surgery.  Continue efforts at mobilization.  Hopeful discharge home tomorrow if patient's mobility allows.

## 2017-07-28 NOTE — Care Management Note (Signed)
Case Management Note  Patient Details  Name: Frank Hill MRN: [id number] Date of Birth: 1940/11/19  Subjective/Objective: 77 yo M s/p decompressive laminectomy L2-3 with PLIF                   Action/Plan: Received referral to assist with [facility] and DME.   Expected Discharge Date:  07/28/17               Expected Discharge Plan:  [place]  In-House Referral:     Discharge planning Services  CM Consult  Post Acute Care Choice:  Home Health Choice offered to:  Patient, Adult Children  DME Arranged:  3-N-1 DME Agency:  [place]:  PT [place]:  [place]  Status of Service:  Completed, signed off  If discussed at [place] of Stay Meetings, dates discussed:    Additional Comments: Met with pt and daughters. Pt plans to return home with the support of his daughters. He has a RW. He needs a 3-in-1 BSC. Discussed preference for a [facility] agency. He reports that his wife used [facility] in the past and they liked them. Contacted Georgina Snell at [facility] for [facility] referral. He accepted the referral. Contacted Reggie at [facility] for DME referral.   Frank Buzzard, RN 07/28/2017, 2:43 PM

## 2017-07-28 NOTE — Discharge Summary (Signed)
Physician Discharge Summary  Patient ID: Frank MelenaKenneth A Ang MRN: [id number] DOB/AGE: 05/03/1940 77 y.o.  Admit date: 07/26/2017 Discharge date: 07/28/2017  Admission Diagnoses:  Discharge Diagnoses:  Active Problems:   S/P lumbar spinal fusion   Discharged Condition: good  Hospital Course: Admitted and underwent uncomplicated lumbar fusion.  Post op Doing well and ready for DC  Consults: None  Significant Diagnostic Studies:   Treatments:   Discharge Exam: Blood pressure 111/74, pulse 69, temperature 99.3 F (37.4 C), temperature source Oral, resp. rate 18, height 5\' 9"  (1.753 m), weight 88.5 kg (195 lb), SpO2 99 %. A/A O x 4 Motor and sensory exam intact Chest clear abd soft  Disposition: Discharge disposition: 01-Home or Self Care       Discharge Instructions    Face-to-face encounter (required for Medicare/Medicaid patients)   Complete by:  As directed    I Temple PaciniHenry A Mirelle Biskup certify that this patient is under my care and that I, or a nurse practitioner or physician's assistant working with me, had a face-to-face encounter that meets the physician face-to-face encounter requirements with this patient on 07/28/2017. The encounter with the patient was in whole, or in part for the following medical condition(s) which is the primary reason for home health care (List medical condition): Lumbar stenosis   The encounter with the patient was in whole, or in part, for the following medical condition, which is the primary reason for home health care:  Lumbar stenois   I certify that, based on my findings, the following services are medically necessary home health services:  Physical therapy   Reason for Medically Necessary Home Health Services:  Therapy- Home Adaptation to Facilitate Safety   My clinical findings support the need for the above services:  Unsafe ambulation due to balance issues   Further, I certify that my clinical findings support that this patient is homebound due to:   Pain interferes with ambulation/mobility   Home Health   Complete by:  As directed    To provide the following care/treatments:   PT OT       Allergies as of 07/28/2017      Reactions   Penicillins Rash, Other (See Comments)   PATIENT HAD A PCN REACTION WITH IMMEDIATE RASH, FACIAL/TONGUE/THROAT SWELLING, SOB, OR LIGHTHEADEDNESS WITH HYPOTENSION:  #  #  #  YES  #  #  # Has patient had a PCN reaction causing severe rash involving mucus membranes or skin necrosis: unknown Has patient had a PCN reaction that required hospitalization no Has patient had a PCN reaction occurring within the last 10 years: no   Ciprofloxacin Other (See Comments)   MUSCLE WEAKNESS   Prednisone Other (See Comments)   DEPRESSION suicidial   Adhesive [tape] Rash      Medication List    TAKE these medications   AMINO ACIDS COMPLEX PO Take 1 tablet by mouth at bedtime. Multi Amino Acids   amLODipine 5 MG tablet Commonly known as:  NORVASC Take 5 mg by mouth 2 (two) times daily.   aspirin EC 81 MG tablet Take 81 mg by mouth daily.   Biotin 5000 MCG Tabs Take 5,000 mcg by mouth daily.   Co Q 10 100 MG Caps Take 100 mg by mouth 2 (two) times daily.   fish oil-omega-3 fatty acids 1000 MG capsule Take 2 g by mouth 2 (two) times daily.   fluticasone 50 MCG/ACT nasal spray Commonly known as:  FLONASE Place 2 sprays into the nose daily  as needed for allergies.   Garlic 1000 MG Caps Take 1,000 mg by mouth 2 (two) times daily.   Ginkgo Biloba 60 MG Caps Take 60 mg by mouth at bedtime.   Glucosamine Sulfate 1000 MG Caps Take 1,000 mg by mouth 2 (two) times daily.   HYALURONIC ACID PO Take 1 tablet by mouth daily.   LECITHIN PO Take 1 tablet by mouth 2 (two) times daily.   lisinopril 20 MG tablet Commonly known as:  PRINIVIL,ZESTRIL Take 20 mg by mouth at bedtime.   meloxicam 15 MG tablet Commonly known as:  MOBIC Take 15 mg by mouth daily.   METAMUCIL FIBER PO Take 1 each by mouth at  bedtime. 2 tablespoons mixes up with cinnamon, vinegar, lemon juice   metFORMIN 1000 MG tablet Commonly known as:  GLUCOPHAGE Take 500 mg by mouth 2 (two) times daily.   MSM 1000 MG Tabs Take 1,000 mg by mouth at bedtime.   multivitamin with minerals Tabs tablet Take 1 tablet by mouth 2 (two) times daily. Multivitamins for Seniors with no iron   niacin 500 MG tablet Commonly known as:  SLO-NIACIN Take 500 mg by mouth at bedtime.   oxyCODONE 5 MG immediate release tablet Commonly known as:  Oxy IR/ROXICODONE Take 1 tablet (5 mg total) by mouth every 3 (three) hours as needed for moderate pain ((score 4 to 6)).   oxyCODONE-acetaminophen 5-325 MG tablet Commonly known as:  PERCOCET/ROXICET Take 1 tablet by mouth every 6 (six) hours as needed for pain.   ranitidine 300 MG tablet Commonly known as:  ZANTAC Take 300 mg by mouth 2 (two) times daily.   ROLAIDS MULTI-SYMPTOM PO Take 2 tablets by mouth every 6 (six) hours as needed (heartburn).   sildenafil 100 MG tablet Commonly known as:  VIAGRA Take 100 mg by mouth daily as needed for erectile dysfunction.   sildenafil 50 MG tablet Commonly known as:  VIAGRA Take 50 mg by mouth daily as needed for erectile dysfunction.   Silica Gel Take 440 mg by mouth at bedtime. HORSE GELATIN SILICA GEL PILL   simvastatin 40 MG tablet Commonly known as:  ZOCOR Take 20 mg by mouth at bedtime.   sotalol 120 MG tablet Commonly known as:  BETAPACE Take 120 mg by mouth 2 (two) times daily.   tetrahydrozoline-zinc 0.05-0.25 % ophthalmic solution Commonly known as:  VISINE-AC Place 2 drops into both eyes 3 (three) times daily as needed (dry eyes).   traMADol 50 MG tablet Commonly known as:  ULTRAM Take 50 mg by mouth 2 (two) times daily as needed (for pain.).   TURMERIC PO Take 800 mg by mouth at bedtime.   VANADYL SULFATE PO Take 10 mg by mouth daily.   vitamin B-12 1000 MCG tablet Commonly known as:  CYANOCOBALAMIN Take 1,000  mcg by mouth at bedtime.   vitamin C 1000 MG tablet Take 1,000 mg by mouth at bedtime.            Durable Medical Equipment  (From admission, onward)        Start     Ordered   07/26/17 2019  DME Walker rolling  Once    Question:  Patient needs a walker to treat with the following condition  Answer:  S/P lumbar fusion   07/26/17 2018   07/26/17 2019  DME 3 n 1  Once     07/26/17 2018       Signed: Sherilyn Cooter A Percy Winterrowd 07/28/2017, 2:18 PM

## 2017-07-28 NOTE — Progress Notes (Signed)
Occupational Therapy Treatment Patient Details Name: Frank Hill MRN: [id number] DOB: [birth year] Today's Date: 07/28/2017    History of present illness Patient is a 77 y/o male admitted for decompressive laminectomy L2-3 with PLIF.  PMH significant for but not limited to for arthritis, DM, GERD, HTN, lumbar lamy 5/18 and TURP.   OT comments  Updated d/c recommendation to [facility], pt with difficulty generalizing back precautions. Educated in use of AE. Pt with anxiety about being home alone most of the time, but less anxious at end of session.  Follow Up Recommendations  Home health OT(Home health aide)    Equipment Recommendations  3 in 1 bedside commode    Recommendations for Other Services      Precautions / Restrictions Precautions Precautions: Fall;Back Precaution Booklet Issued: Yes (comment) Precaution Comments: pt needing moderate cues to state, reinforced with mobility, ADL and IADL Required Braces or Orthoses: Spinal Brace Spinal Brace: Lumbar corset;Applied in sitting position       Mobility Bed Mobility Overal bed mobility: Needs Assistance Bed Mobility: Rolling;Sidelying to Sit Rolling: Min guard Sidelying to sit: Min guard       General bed mobility comments: cues for log roll, HOB flat, no rail  Transfers Overall transfer level: Needs assistance Equipment used: Rolling walker (2 wheeled) Transfers: Sit to/from Stand Sit to Stand: Supervision         General transfer comment: increased time, pt with good hand placement, cues for posture    Balance                                           ADL either performed or assessed with clinical judgement   ADL Overall ADL's : Needs assistance/impaired             Lower Body Bathing: Sit to/from stand;Supervison/ safety Lower Body Bathing Details (indicate cue type and reason): issued and instructed in use of long handled bath sponge Upper Body Dressing : Sitting;Set up Upper  Body Dressing Details (indicate cue type and reason): including back brace Lower Body Dressing: Sit to/from stand;With adaptive equipment;Supervision/safety Lower Body Dressing Details (indicate cue type and reason): issued and educated in use of reacher, sock aide, long shoe horn Toilet Transfer: Supervision/safety;Ambulation;BSC;RW     Toileting - Clothing Manipulation Details (indicate cue type and reason): educated to avoid twisting with pericare     Functional mobility during ADLs: Supervision/safety;Rolling walker General ADL Comments: Pt educated in how to transport items safely with RW     Vision       Perception     Praxis      Cognition Arousal/Alertness: Awake/alert Behavior During Therapy: Anxious Overall Cognitive Status: Within Functional Limits for tasks assessed                                          Exercises     Shoulder Instructions       General Comments      Pertinent Vitals/ Pain       Pain Assessment: Faces Faces Pain Scale: Hurts little more Pain Location: incisional Pain Descriptors / Indicators: Aching;Grimacing;Operative site guarding Pain Intervention(s): Monitored during session;Premedicated before session;Repositioned  Home Living  Prior Functioning/Environment              Frequency  Min 2X/week        Progress Toward Goals  OT Goals(current goals can now be found in the care plan section)  Progress towards OT goals: Progressing toward goals  Acute Rehab OT Goals Patient Stated Goal: to go home OT Goal Formulation: With patient/family Time For Goal Achievement: 08/10/17 Potential to Achieve Goals: Good  Plan Discharge plan needs to be updated    Co-evaluation                 AM-PAC PT "6 Clicks" Daily Activity     Outcome Measure   Help from another person eating meals?: None Help from another person taking care of personal  grooming?: A Little Help from another person toileting, which includes using toliet, bedpan, or urinal?: A Little Help from another person bathing (including washing, rinsing, drying)?: A Little Help from another person to put on and taking off regular upper body clothing?: None Help from another person to put on and taking off regular lower body clothing?: A Little 6 Click Score: 20    End of Session Equipment Utilized During Treatment: Gait belt;Rolling walker;Back brace  OT Visit Diagnosis: Other abnormalities of gait and mobility (R26.89)   Activity Tolerance Patient tolerated treatment well   Patient Left in bed;with call bell/phone within reach   Nurse Communication          Time: 1009-1100 OT Time Calculation (min): 51 min  Charges: OT General Charges $OT Visit: 1 Visit OT Treatments $Self Care/Home Management : 38-52 mins  07/28/2017 Martie RoundJulie Nora Rooke, OTR/L Pager: [phone] Evern BioMayberry, Mateo Overbeck Lynn 07/28/2017, 11:08 AM

## 2017-07-28 NOTE — Progress Notes (Addendum)
[place] [facility] [place] on [address]  Will need HHPT, and FWRW on discharge.

## 2017-07-28 NOTE — Discharge Instructions (Signed)
Laminectomy, Care After This sheet gives you information about how to care for yourself after your procedure. Your health care provider may also give you more specific instructions. If you have problems or questions, contact your health care provider. What can I expect after the procedure? After the procedure, it is common to have:  Some pain around your incision area.  Muscle tightening (spasms) across the back.  Follow these instructions at home: Incision care  Follow instructions from your health care provider about how to take care of your incision area. Make sure you: ? Wash your hands with soap and water before and after you apply medicine to the area or change your bandage (dressing). If soap and water are not available, use hand sanitizer. ? Change your dressing as told by your health care provider. ? Leave stitches (sutures), skin glue, or adhesive strips in place. These skin closures may need to stay in place for 2 weeks or longer. If adhesive strip edges start to loosen and curl up, you may trim the loose edges. Do not remove adhesive strips completely unless your health care provider tells you to do that.  Check your incision area every day for signs of infection. Check for: ? More redness, swelling, or pain. ? More fluid or blood. ? Warmth. ? Pus or a bad smell. Medicines  Take over-the-counter and prescription medicines only as told by your health care provider.  If you were prescribed an antibiotic medicine, use it as told by your health care provider. Do not stop using the antibiotic even if you start to feel better. Bathing  Do not take baths, swim, or use a hot tub for 2 weeks, or until your incision has healed completely.  If your health care provider approves, you may take showers after your dressing has been removed. Activity  Return to your normal activities as told by your health care provider. Ask your health care provider what activities are safe for  you.  Avoid bending or twisting at your waist. Always bend at your knees.  Do not sit for more than 20-30 minutes at a time. Lie down or walk between periods of sitting.  Do not lift anything that is heavier than 10 lb (4.5 kg) or the limit that your health care provider tells you, until he or she says that it is safe.  Do not drive for 2 weeks after your procedure or for as long as your health care provider tells you.  Do not drive or use heavy machinery while taking prescription pain medicine. General instructions  To prevent or treat constipation while you are taking prescription pain medicine, your health care provider may recommend that you: ? Drink enough fluid to keep your urine clear or pale yellow. ? Take over-the-counter or prescription medicines. ? Eat foods that are high in fiber, such as fresh fruits and vegetables, whole grains, and beans. ? Limit foods that are high in fat and processed sugars, such as fried and sweet foods.  Do breathing exercises as told.  Keep all follow-up visits as told by your health care provider. This is important. Contact a health care provider if:  You have more redness, swelling, or pain around your incision area.  Your incision feels warm to the touch.  You are not able to return to activities or do exercises as told by your health care provider. Get help right away if:  You have: ? More fluid or blood coming from your incision area. ? Pus  or a bad smell coming from your incision area. ? Chills or a fever. ? Episodes of dizziness or fainting while standing.  You develop a rash.  You develop shortness of breath or you have difficulty breathing.  You cannot control when you urinate or have a bowel movement.  You become weak.  You are not able to use your legs. Summary  After the procedure, it is common to have some pain around your incision area. You may also have muscle tightening (spasms) across the back.  Follow  instructions from your health care provider about how to care for your incision.  Do not lift anything that is heavier than 10 lb (4.5 kg) or the limit that your health care provider tells you, until he or she says that it is safe.  Contact your health care provider if you have more redness, swelling, or pain around your incision area or if your incision feels warm to the touch. These can be signs of infection. This information is not intended to replace advice given to you by your health care provider. Make sure you discuss any questions you have with your health care provider. Document Released: 08/05/2004 Document Revised: 09/02/2015 Document Reviewed: 07/04/2015 Elsevier Interactive Patient Education  2018 [employer] Wound Care Keep incision covered and dry for two days.  If you shower, cover incision with plastic wrap.  Do not put any creams, lotions, or ointments on incision. Leave steri-strips on back.  They will fall off by themselves. Activity Walk each and every day, increasing distance each day. No lifting greater than 5 lbs.  Avoid excessive neck motion. No driving for 2 weeks; may ride as a passenger locally. If provided with back brace, wear when out of bed.  It is not necessary to wear brace in bed. Diet Resume your normal diet.  Return to Work Will be discussed at you follow up appointment. Call Your Doctor If Any of These Occur Redness, drainage, or swelling at the wound.  Temperature greater than 101 degrees. Severe pain not relieved by pain medication. Incision starts to come apart. Follow Up Appt Call today for appointment in 1-2 weeks [phone]) or for problems.  If you have any hardware placed in your spine, you will need an x-ray before your appointment.

## 2017-07-31 DIAGNOSIS — M48061 Spinal stenosis, lumbar region without neurogenic claudication: Secondary | ICD-10-CM | POA: Diagnosis not present

## 2017-07-31 DIAGNOSIS — R269 Unspecified abnormalities of gait and mobility: Secondary | ICD-10-CM | POA: Diagnosis not present

## 2017-08-01 ENCOUNTER — Other Ambulatory Visit: Payer: Self-pay

## 2017-08-01 NOTE — Patient Outreach (Addendum)
[facility]) Care Management  [facility]   08/01/2017  Frank Hill [birth year] [id number]   77 year old male outreached to [facility] services for 30 day post discharge medication review.  PMHx includes, but not limited to, atrial flutter, hypertension, Type 2 diabetes mellitus, neuropathy, arthritis, BPH, hypercholesterolemia and s/p lumbar fusion.  Subjective: Mr. Olafson reports that he is doing well after his recent back surgery.  He states that he is getting around some and has physical therapy visits.  He reports that he does not check his CBGs.  He has a glucometer, but waits to get his HgA1c checked.  He states that he felt his glucose was high, so he increased his metformin to 1000 mg twice daily about a month ago.  Patient reports that he is currently not taking his OTC supplements.  He states he will resume them soon, but had stopped prior to surgery.  He reports that his is in the process of switching his PCP to Dr. Albertina Senegal at [facility] who is covered by [facility].   Objective:  HgA1c 6.4% on 07/26/17 SCr 1.12 mg/dL  Current Medications: Current Outpatient Medications  Medication Sig Dispense Refill  . amLODipine (NORVASC) 5 MG tablet Take 5 mg by mouth 2 (two) times daily.     . Ascorbic Acid (VITAMIN C) 1000 MG tablet Take 1,000 mg by mouth at bedtime.     Marland Kitchen aspirin EC 81 MG tablet Take 81 mg by mouth daily.    . fish oil-omega-3 fatty acids 1000 MG capsule Take 2 g by mouth 2 (two) times daily.    . fluticasone (FLONASE) 50 MCG/ACT nasal spray Place 2 sprays into the nose daily as needed for allergies.     Marland Kitchen lisinopril (PRINIVIL,ZESTRIL) 20 MG tablet Take 20 mg by mouth at bedtime.     . meloxicam (MOBIC) 15 MG tablet Take 15 mg by mouth daily.    . metFORMIN (GLUCOPHAGE) 1000 MG tablet Take 500 mg by mouth 2 (two) times daily. Pt reports he has been taking 1000 mg twice daily without the doctor's recommendation for about 1 month,  recorded 08/01/17.    . Multiple Vitamin (MULTIVITAMIN WITH MINERALS) TABS Take 1 tablet by mouth 2 (two) times daily. Multivitamins for Seniors with no iron    . niacin (SLO-NIACIN) 500 MG tablet Take 500 mg by mouth at bedtime.    Marland Kitchen oxyCODONE (OXY IR/ROXICODONE) 5 MG immediate release tablet Take 1 tablet (5 mg total) by mouth every 3 (three) hours as needed for moderate pain ((score 4 to 6)). 30 tablet 0  . ranitidine (ZANTAC) 300 MG tablet Take 300 mg by mouth 2 (two) times daily.    . simvastatin (ZOCOR) 40 MG tablet Take 20 mg by mouth at bedtime.    . sotalol (BETAPACE) 120 MG tablet Take 120 mg by mouth 2 (two) times daily.    Marland Kitchen tetrahydrozoline-zinc (VISINE-AC) 0.05-0.25 % ophthalmic solution Place 2 drops into both eyes 3 (three) times daily as needed (dry eyes).    . traMADol (ULTRAM) 50 MG tablet Take 50 mg by mouth 2 (two) times daily as needed (for pain.).     Marland Kitchen AMINO ACIDS COMPLEX PO Take 1 tablet by mouth at bedtime. Multi Amino Acids    . Biotin 5000 MCG TABS Take 5,000 mcg by mouth daily.    . Calcium Carbonate-Simethicone (ROLAIDS MULTI-SYMPTOM PO) Take 2 tablets by mouth every 6 (six) hours as needed (heartburn).     Marland Kitchen  Coenzyme Q10 (CO Q 10) 100 MG CAPS Take 100 mg by mouth 2 (two) times daily.     . Garlic 1000 MG CAPS Take 1,000 mg by mouth 2 (two) times daily.    . Ginkgo Biloba 60 MG CAPS Take 60 mg by mouth at bedtime.    . Glucosamine Sulfate 1000 MG CAPS Take 1,000 mg by mouth 2 (two) times daily.    Marland Kitchen. Hyaluronic Acid-Vitamin C (HYALURONIC ACID PO) Take 1 tablet by mouth daily.    Marland Kitchen. LECITHIN PO Take 1 tablet by mouth 2 (two) times daily.    . Methylsulfonylmethane (MSM) 1000 MG TABS Take 1,000 mg by mouth at bedtime.     . Psyllium (METAMUCIL FIBER PO) Take 1 each by mouth at bedtime. 2 tablespoons mixes up with cinnamon, vinegar, lemon juice    . sildenafil (VIAGRA) 50 MG tablet Take 50 mg by mouth daily as needed for erectile dysfunction.    . Silica GEL Take 440 mg by  mouth at bedtime. HORSE GELATIN SILICA GEL PILL    . TURMERIC PO Take 800 mg by mouth at bedtime.    Marland Kitchen. VANADYL SULFATE PO Take 10 mg by mouth daily.     . vitamin B-12 (CYANOCOBALAMIN) 1000 MCG tablet Take 1,000 mcg by mouth at bedtime.      No current facility-administered medications for this visit.     Functional Status: In your present state of health, do you have any difficulty performing the following activities: 07/27/2017 07/26/2017  Hearing? Y -  Vision? N -  Difficulty concentrating or making decisions? N -  Walking or climbing stairs? - -  Dressing or bathing? Y -  Doing errands, shopping? Y Y  Some recent data might be hidden   Fall/Depression Screening: No flowsheet data found. No flowsheet data found.  ASSESSMENT: Date Discharged from Hospital: 07/28/17 Date Medication Reconciliation Performed: 08/01/2017  New Medications at Discharge:   Oxycodone IR 5mg    Patient was recently discharged from hospital and all medications have been reviewed  Drugs sorted by system:  Cardiovascular: amlodipine, aspirin, lisinopril, niacin, simvastatin, sotalol  Pulmonary/Allergy: fluticasone  Gastrointestinal: psyllium, ranitidine  Endocrine: metformin  Topical: visine-ac  Pain: meloxicam, oxycodone, tramadol  Vitamins/Minerals: ascorbic acid, MVI, vitamin B-12  Miscellaneous: amino acid complex, biotin, rolaids, coenzyme q 10, fish oil, garlic, ginkgo biloba, glucosamine, hyaluronic acid/vitamin C, lecithin, methylsulfonylmethane, sildenafil, silica, tumeric, vanadyl   Medications to avoid in the elderly:  Per the [employer] List- Meloxicam has an increased risk of gastrointestinal bleeding or peptic ulcer disease in patients > 77 yo.  Recommended to avoid chronic use in the elderly.   Plan: Route note to new PCP, Dr. Julius BowelsPollock.  Berlin HunJennifer Khristen Cheyney, PharmD Clinical Pharmacist [facility] [phone]

## 2017-08-08 DIAGNOSIS — R5383 Other fatigue: Secondary | ICD-10-CM | POA: Diagnosis not present

## 2017-08-08 DIAGNOSIS — R55 Syncope and collapse: Secondary | ICD-10-CM | POA: Diagnosis not present

## 2017-08-08 DIAGNOSIS — R21 Rash and other nonspecific skin eruption: Secondary | ICD-10-CM | POA: Diagnosis not present

## 2017-08-09 DIAGNOSIS — M48061 Spinal stenosis, lumbar region without neurogenic claudication: Secondary | ICD-10-CM | POA: Diagnosis not present

## 2017-08-09 DIAGNOSIS — Z4789 Encounter for other orthopedic aftercare: Secondary | ICD-10-CM | POA: Diagnosis not present

## 2017-08-09 DIAGNOSIS — K219 Gastro-esophageal reflux disease without esophagitis: Secondary | ICD-10-CM | POA: Diagnosis not present

## 2017-08-09 DIAGNOSIS — E119 Type 2 diabetes mellitus without complications: Secondary | ICD-10-CM | POA: Diagnosis not present

## 2017-08-09 DIAGNOSIS — I10 Essential (primary) hypertension: Secondary | ICD-10-CM | POA: Diagnosis not present

## 2017-08-21 DIAGNOSIS — E119 Type 2 diabetes mellitus without complications: Secondary | ICD-10-CM | POA: Diagnosis not present

## 2017-08-21 DIAGNOSIS — K219 Gastro-esophageal reflux disease without esophagitis: Secondary | ICD-10-CM | POA: Diagnosis not present

## 2017-08-21 DIAGNOSIS — Z4789 Encounter for other orthopedic aftercare: Secondary | ICD-10-CM | POA: Diagnosis not present

## 2017-08-21 DIAGNOSIS — M48061 Spinal stenosis, lumbar region without neurogenic claudication: Secondary | ICD-10-CM | POA: Diagnosis not present

## 2017-08-21 DIAGNOSIS — I10 Essential (primary) hypertension: Secondary | ICD-10-CM | POA: Diagnosis not present

## 2017-08-22 DIAGNOSIS — E119 Type 2 diabetes mellitus without complications: Secondary | ICD-10-CM | POA: Diagnosis not present

## 2017-08-22 DIAGNOSIS — E291 Testicular hypofunction: Secondary | ICD-10-CM | POA: Diagnosis not present

## 2017-08-24 DIAGNOSIS — E291 Testicular hypofunction: Secondary | ICD-10-CM | POA: Diagnosis not present

## 2017-08-24 DIAGNOSIS — M549 Dorsalgia, unspecified: Secondary | ICD-10-CM | POA: Diagnosis not present

## 2017-08-24 DIAGNOSIS — E119 Type 2 diabetes mellitus without complications: Secondary | ICD-10-CM | POA: Diagnosis not present

## 2017-08-24 DIAGNOSIS — F529 Unspecified sexual dysfunction not due to a substance or known physiological condition: Secondary | ICD-10-CM | POA: Diagnosis not present

## 2017-08-28 DIAGNOSIS — E291 Testicular hypofunction: Secondary | ICD-10-CM | POA: Diagnosis not present

## 2017-08-28 DIAGNOSIS — Z125 Encounter for screening for malignant neoplasm of prostate: Secondary | ICD-10-CM | POA: Diagnosis not present

## 2017-08-30 DIAGNOSIS — Z4889 Encounter for other specified surgical aftercare: Secondary | ICD-10-CM | POA: Diagnosis not present

## 2017-08-30 DIAGNOSIS — E119 Type 2 diabetes mellitus without complications: Secondary | ICD-10-CM | POA: Diagnosis not present

## 2017-08-30 DIAGNOSIS — I10 Essential (primary) hypertension: Secondary | ICD-10-CM | POA: Diagnosis not present

## 2017-09-18 DIAGNOSIS — M5416 Radiculopathy, lumbar region: Secondary | ICD-10-CM | POA: Diagnosis not present

## 2017-12-18 DIAGNOSIS — M5416 Radiculopathy, lumbar region: Secondary | ICD-10-CM | POA: Diagnosis not present

## 2018-01-18 DIAGNOSIS — Z09 Encounter for follow-up examination after completed treatment for conditions other than malignant neoplasm: Secondary | ICD-10-CM | POA: Diagnosis not present

## 2018-01-18 DIAGNOSIS — R5383 Other fatigue: Secondary | ICD-10-CM | POA: Diagnosis not present

## 2018-01-18 DIAGNOSIS — E559 Vitamin D deficiency, unspecified: Secondary | ICD-10-CM | POA: Diagnosis not present

## 2018-01-18 DIAGNOSIS — E785 Hyperlipidemia, unspecified: Secondary | ICD-10-CM | POA: Diagnosis not present

## 2018-01-18 DIAGNOSIS — G933 Postviral fatigue syndrome: Secondary | ICD-10-CM | POA: Diagnosis not present

## 2018-01-18 DIAGNOSIS — E119 Type 2 diabetes mellitus without complications: Secondary | ICD-10-CM | POA: Diagnosis not present

## 2018-01-25 DIAGNOSIS — I10 Essential (primary) hypertension: Secondary | ICD-10-CM | POA: Diagnosis not present

## 2018-01-25 DIAGNOSIS — E291 Testicular hypofunction: Secondary | ICD-10-CM | POA: Diagnosis not present

## 2018-01-25 DIAGNOSIS — M549 Dorsalgia, unspecified: Secondary | ICD-10-CM | POA: Diagnosis not present

## 2018-01-25 DIAGNOSIS — E119 Type 2 diabetes mellitus without complications: Secondary | ICD-10-CM | POA: Diagnosis not present

## 2018-02-11 DIAGNOSIS — E1169 Type 2 diabetes mellitus with other specified complication: Secondary | ICD-10-CM | POA: Diagnosis not present

## 2018-02-11 DIAGNOSIS — E785 Hyperlipidemia, unspecified: Secondary | ICD-10-CM | POA: Diagnosis not present

## 2018-02-11 DIAGNOSIS — E1140 Type 2 diabetes mellitus with diabetic neuropathy, unspecified: Secondary | ICD-10-CM | POA: Diagnosis not present

## 2018-02-11 DIAGNOSIS — I10 Essential (primary) hypertension: Secondary | ICD-10-CM | POA: Diagnosis not present

## 2018-02-11 DIAGNOSIS — E1159 Type 2 diabetes mellitus with other circulatory complications: Secondary | ICD-10-CM | POA: Diagnosis not present

## 2018-02-11 DIAGNOSIS — I4892 Unspecified atrial flutter: Secondary | ICD-10-CM | POA: Diagnosis not present

## 2018-04-08 DIAGNOSIS — Z7689 Persons encountering health services in other specified circumstances: Secondary | ICD-10-CM | POA: Diagnosis not present

## 2018-04-08 DIAGNOSIS — E1169 Type 2 diabetes mellitus with other specified complication: Secondary | ICD-10-CM | POA: Diagnosis not present

## 2018-04-08 DIAGNOSIS — E1159 Type 2 diabetes mellitus with other circulatory complications: Secondary | ICD-10-CM | POA: Diagnosis not present

## 2018-04-08 DIAGNOSIS — E785 Hyperlipidemia, unspecified: Secondary | ICD-10-CM | POA: Diagnosis not present

## 2018-04-08 DIAGNOSIS — I10 Essential (primary) hypertension: Secondary | ICD-10-CM | POA: Diagnosis not present

## 2018-04-08 DIAGNOSIS — E1140 Type 2 diabetes mellitus with diabetic neuropathy, unspecified: Secondary | ICD-10-CM | POA: Diagnosis not present

## 2018-07-25 DIAGNOSIS — N138 Other obstructive and reflux uropathy: Secondary | ICD-10-CM | POA: Diagnosis not present

## 2018-07-25 DIAGNOSIS — S0502XA Injury of conjunctiva and corneal abrasion without foreign body, left eye, initial encounter: Secondary | ICD-10-CM | POA: Diagnosis not present

## 2018-07-25 DIAGNOSIS — I10 Essential (primary) hypertension: Secondary | ICD-10-CM | POA: Diagnosis not present

## 2018-07-25 DIAGNOSIS — Z87891 Personal history of nicotine dependence: Secondary | ICD-10-CM | POA: Diagnosis not present

## 2018-07-25 DIAGNOSIS — E1140 Type 2 diabetes mellitus with diabetic neuropathy, unspecified: Secondary | ICD-10-CM | POA: Diagnosis not present

## 2018-07-25 DIAGNOSIS — H1132 Conjunctival hemorrhage, left eye: Secondary | ICD-10-CM | POA: Diagnosis not present

## 2018-07-25 DIAGNOSIS — E1169 Type 2 diabetes mellitus with other specified complication: Secondary | ICD-10-CM | POA: Diagnosis not present

## 2018-07-25 DIAGNOSIS — E1159 Type 2 diabetes mellitus with other circulatory complications: Secondary | ICD-10-CM | POA: Diagnosis not present

## 2018-07-25 DIAGNOSIS — N401 Enlarged prostate with lower urinary tract symptoms: Secondary | ICD-10-CM | POA: Diagnosis not present

## 2018-07-25 DIAGNOSIS — Z7984 Long term (current) use of oral hypoglycemic drugs: Secondary | ICD-10-CM | POA: Diagnosis not present

## 2018-07-25 DIAGNOSIS — Z1211 Encounter for screening for malignant neoplasm of colon: Secondary | ICD-10-CM | POA: Diagnosis not present

## 2018-07-25 DIAGNOSIS — E785 Hyperlipidemia, unspecified: Secondary | ICD-10-CM | POA: Diagnosis not present

## 2019-01-28 DIAGNOSIS — H903 Sensorineural hearing loss, bilateral: Secondary | ICD-10-CM | POA: Diagnosis not present

## 2019-01-29 DIAGNOSIS — L820 Inflamed seborrheic keratosis: Secondary | ICD-10-CM | POA: Diagnosis not present

## 2019-01-29 DIAGNOSIS — L570 Actinic keratosis: Secondary | ICD-10-CM | POA: Diagnosis not present

## 2019-02-07 DIAGNOSIS — H6122 Impacted cerumen, left ear: Secondary | ICD-10-CM | POA: Diagnosis not present

## 2019-02-07 DIAGNOSIS — E1159 Type 2 diabetes mellitus with other circulatory complications: Secondary | ICD-10-CM | POA: Diagnosis not present

## 2019-02-07 DIAGNOSIS — M792 Neuralgia and neuritis, unspecified: Secondary | ICD-10-CM | POA: Diagnosis not present

## 2019-02-07 DIAGNOSIS — E1140 Type 2 diabetes mellitus with diabetic neuropathy, unspecified: Secondary | ICD-10-CM | POA: Diagnosis not present

## 2019-02-07 DIAGNOSIS — I10 Essential (primary) hypertension: Secondary | ICD-10-CM | POA: Diagnosis not present

## 2019-02-26 DIAGNOSIS — H903 Sensorineural hearing loss, bilateral: Secondary | ICD-10-CM | POA: Diagnosis not present

## 2019-03-10 DIAGNOSIS — D649 Anemia, unspecified: Secondary | ICD-10-CM | POA: Diagnosis not present

## 2019-03-10 DIAGNOSIS — N289 Disorder of kidney and ureter, unspecified: Secondary | ICD-10-CM | POA: Diagnosis not present

## 2019-03-10 DIAGNOSIS — E1140 Type 2 diabetes mellitus with diabetic neuropathy, unspecified: Secondary | ICD-10-CM | POA: Diagnosis not present

## 2019-03-10 DIAGNOSIS — E785 Hyperlipidemia, unspecified: Secondary | ICD-10-CM | POA: Diagnosis not present

## 2019-03-10 DIAGNOSIS — E1159 Type 2 diabetes mellitus with other circulatory complications: Secondary | ICD-10-CM | POA: Diagnosis not present

## 2019-03-10 DIAGNOSIS — Z1159 Encounter for screening for other viral diseases: Secondary | ICD-10-CM | POA: Diagnosis not present

## 2019-03-10 DIAGNOSIS — I10 Essential (primary) hypertension: Secondary | ICD-10-CM | POA: Diagnosis not present

## 2019-03-10 DIAGNOSIS — E1169 Type 2 diabetes mellitus with other specified complication: Secondary | ICD-10-CM | POA: Diagnosis not present

## 2019-03-24 DIAGNOSIS — I10 Essential (primary) hypertension: Secondary | ICD-10-CM | POA: Diagnosis not present

## 2019-03-24 DIAGNOSIS — E785 Hyperlipidemia, unspecified: Secondary | ICD-10-CM | POA: Diagnosis not present

## 2019-03-24 DIAGNOSIS — E119 Type 2 diabetes mellitus without complications: Secondary | ICD-10-CM | POA: Diagnosis not present

## 2019-03-24 DIAGNOSIS — I4892 Unspecified atrial flutter: Secondary | ICD-10-CM | POA: Diagnosis not present

## 2019-06-09 DIAGNOSIS — E1159 Type 2 diabetes mellitus with other circulatory complications: Secondary | ICD-10-CM | POA: Diagnosis not present

## 2019-06-09 DIAGNOSIS — I152 Hypertension secondary to endocrine disorders: Secondary | ICD-10-CM | POA: Diagnosis not present

## 2019-06-09 DIAGNOSIS — E1165 Type 2 diabetes mellitus with hyperglycemia: Secondary | ICD-10-CM | POA: Diagnosis not present

## 2019-06-13 DIAGNOSIS — E875 Hyperkalemia: Secondary | ICD-10-CM | POA: Diagnosis not present

## 2019-07-22 DIAGNOSIS — M1711 Unilateral primary osteoarthritis, right knee: Secondary | ICD-10-CM | POA: Diagnosis not present

## 2019-07-22 DIAGNOSIS — M25552 Pain in left hip: Secondary | ICD-10-CM | POA: Diagnosis not present

## 2019-07-22 DIAGNOSIS — M25461 Effusion, right knee: Secondary | ICD-10-CM | POA: Diagnosis not present

## 2019-07-30 DIAGNOSIS — L570 Actinic keratosis: Secondary | ICD-10-CM | POA: Diagnosis not present

## 2019-07-30 DIAGNOSIS — L821 Other seborrheic keratosis: Secondary | ICD-10-CM | POA: Diagnosis not present

## 2019-08-14 DIAGNOSIS — M545 Low back pain: Secondary | ICD-10-CM | POA: Diagnosis not present

## 2019-08-14 DIAGNOSIS — G8929 Other chronic pain: Secondary | ICD-10-CM | POA: Diagnosis not present

## 2019-09-16 DIAGNOSIS — M1711 Unilateral primary osteoarthritis, right knee: Secondary | ICD-10-CM | POA: Diagnosis not present

## 2019-09-16 DIAGNOSIS — M65161 Other infective (teno)synovitis, right knee: Secondary | ICD-10-CM | POA: Diagnosis not present

## 2019-09-16 DIAGNOSIS — M25461 Effusion, right knee: Secondary | ICD-10-CM | POA: Diagnosis not present

## 2019-09-24 DIAGNOSIS — H35363 Drusen (degenerative) of macula, bilateral: Secondary | ICD-10-CM | POA: Diagnosis not present

## 2019-09-24 DIAGNOSIS — Z7984 Long term (current) use of oral hypoglycemic drugs: Secondary | ICD-10-CM | POA: Diagnosis not present

## 2019-09-24 DIAGNOSIS — E119 Type 2 diabetes mellitus without complications: Secondary | ICD-10-CM | POA: Diagnosis not present

## 2019-09-24 DIAGNOSIS — H524 Presbyopia: Secondary | ICD-10-CM | POA: Diagnosis not present

## 2019-09-24 DIAGNOSIS — H5203 Hypermetropia, bilateral: Secondary | ICD-10-CM | POA: Diagnosis not present

## 2019-09-24 DIAGNOSIS — H2513 Age-related nuclear cataract, bilateral: Secondary | ICD-10-CM | POA: Diagnosis not present

## 2019-09-24 DIAGNOSIS — H02831 Dermatochalasis of right upper eyelid: Secondary | ICD-10-CM | POA: Diagnosis not present

## 2019-09-24 DIAGNOSIS — H25013 Cortical age-related cataract, bilateral: Secondary | ICD-10-CM | POA: Diagnosis not present

## 2019-09-24 DIAGNOSIS — H353131 Nonexudative age-related macular degeneration, bilateral, early dry stage: Secondary | ICD-10-CM | POA: Diagnosis not present

## 2019-09-24 DIAGNOSIS — H02834 Dermatochalasis of left upper eyelid: Secondary | ICD-10-CM | POA: Diagnosis not present

## 2019-09-24 DIAGNOSIS — H35443 Age-related reticular degeneration of retina, bilateral: Secondary | ICD-10-CM | POA: Diagnosis not present

## 2019-09-24 DIAGNOSIS — H52203 Unspecified astigmatism, bilateral: Secondary | ICD-10-CM | POA: Diagnosis not present

## 2019-10-02 DIAGNOSIS — M25552 Pain in left hip: Secondary | ICD-10-CM | POA: Diagnosis not present

## 2019-10-13 DIAGNOSIS — G8929 Other chronic pain: Secondary | ICD-10-CM | POA: Diagnosis not present

## 2019-10-13 DIAGNOSIS — M545 Low back pain: Secondary | ICD-10-CM | POA: Diagnosis not present

## 2019-10-13 DIAGNOSIS — M5416 Radiculopathy, lumbar region: Secondary | ICD-10-CM | POA: Diagnosis not present

## 2019-10-17 DIAGNOSIS — E1165 Type 2 diabetes mellitus with hyperglycemia: Secondary | ICD-10-CM | POA: Diagnosis not present

## 2019-10-17 DIAGNOSIS — I152 Hypertension secondary to endocrine disorders: Secondary | ICD-10-CM | POA: Diagnosis not present

## 2019-10-17 DIAGNOSIS — Z23 Encounter for immunization: Secondary | ICD-10-CM | POA: Diagnosis not present

## 2019-10-17 DIAGNOSIS — E1169 Type 2 diabetes mellitus with other specified complication: Secondary | ICD-10-CM | POA: Diagnosis not present

## 2019-10-17 DIAGNOSIS — E785 Hyperlipidemia, unspecified: Secondary | ICD-10-CM | POA: Diagnosis not present

## 2019-10-17 DIAGNOSIS — E1159 Type 2 diabetes mellitus with other circulatory complications: Secondary | ICD-10-CM | POA: Diagnosis not present

## 2019-10-17 DIAGNOSIS — N289 Disorder of kidney and ureter, unspecified: Secondary | ICD-10-CM | POA: Diagnosis not present

## 2019-11-07 DIAGNOSIS — K1120 Sialoadenitis, unspecified: Secondary | ICD-10-CM | POA: Diagnosis not present

## 2019-11-07 DIAGNOSIS — R6884 Jaw pain: Secondary | ICD-10-CM | POA: Diagnosis not present

## 2019-11-20 DIAGNOSIS — Z981 Arthrodesis status: Secondary | ICD-10-CM | POA: Diagnosis not present

## 2019-11-20 DIAGNOSIS — M47816 Spondylosis without myelopathy or radiculopathy, lumbar region: Secondary | ICD-10-CM | POA: Diagnosis not present

## 2019-11-25 DIAGNOSIS — S20212A Contusion of left front wall of thorax, initial encounter: Secondary | ICD-10-CM | POA: Diagnosis not present

## 2019-11-27 DIAGNOSIS — S40021A Contusion of right upper arm, initial encounter: Secondary | ICD-10-CM | POA: Diagnosis not present

## 2019-11-27 DIAGNOSIS — S299XXA Unspecified injury of thorax, initial encounter: Secondary | ICD-10-CM | POA: Diagnosis not present

## 2019-11-27 DIAGNOSIS — S3991XA Unspecified injury of abdomen, initial encounter: Secondary | ICD-10-CM | POA: Diagnosis not present

## 2019-11-27 DIAGNOSIS — M19011 Primary osteoarthritis, right shoulder: Secondary | ICD-10-CM | POA: Diagnosis not present

## 2019-11-27 DIAGNOSIS — D649 Anemia, unspecified: Secondary | ICD-10-CM | POA: Diagnosis not present

## 2019-11-27 DIAGNOSIS — R0781 Pleurodynia: Secondary | ICD-10-CM | POA: Diagnosis not present

## 2019-11-27 DIAGNOSIS — M25511 Pain in right shoulder: Secondary | ICD-10-CM | POA: Diagnosis not present

## 2019-11-27 DIAGNOSIS — J849 Interstitial pulmonary disease, unspecified: Secondary | ICD-10-CM | POA: Diagnosis not present

## 2019-11-27 DIAGNOSIS — I700 Atherosclerosis of aorta: Secondary | ICD-10-CM | POA: Diagnosis not present

## 2019-11-27 DIAGNOSIS — S8011XA Contusion of right lower leg, initial encounter: Secondary | ICD-10-CM | POA: Diagnosis not present

## 2019-11-27 DIAGNOSIS — S40022A Contusion of left upper arm, initial encounter: Secondary | ICD-10-CM | POA: Diagnosis not present

## 2019-11-27 DIAGNOSIS — Y92410 Unspecified street and highway as the place of occurrence of the external cause: Secondary | ICD-10-CM | POA: Diagnosis not present

## 2019-11-27 DIAGNOSIS — I2510 Atherosclerotic heart disease of native coronary artery without angina pectoris: Secondary | ICD-10-CM | POA: Diagnosis not present

## 2019-11-27 DIAGNOSIS — S80211A Abrasion, right knee, initial encounter: Secondary | ICD-10-CM | POA: Diagnosis not present

## 2019-11-27 DIAGNOSIS — S80212A Abrasion, left knee, initial encounter: Secondary | ICD-10-CM | POA: Diagnosis not present

## 2019-11-27 DIAGNOSIS — Z23 Encounter for immunization: Secondary | ICD-10-CM | POA: Diagnosis not present

## 2019-11-27 DIAGNOSIS — D7389 Other diseases of spleen: Secondary | ICD-10-CM | POA: Diagnosis not present

## 2019-11-27 DIAGNOSIS — I708 Atherosclerosis of other arteries: Secondary | ICD-10-CM | POA: Diagnosis not present

## 2019-11-27 DIAGNOSIS — S60511A Abrasion of right hand, initial encounter: Secondary | ICD-10-CM | POA: Diagnosis not present

## 2019-11-27 DIAGNOSIS — M5459 Other low back pain: Secondary | ICD-10-CM | POA: Diagnosis not present

## 2019-11-27 DIAGNOSIS — S8012XA Contusion of left lower leg, initial encounter: Secondary | ICD-10-CM | POA: Diagnosis not present

## 2020-01-06 DIAGNOSIS — I152 Hypertension secondary to endocrine disorders: Secondary | ICD-10-CM | POA: Diagnosis not present

## 2020-01-06 DIAGNOSIS — Z7984 Long term (current) use of oral hypoglycemic drugs: Secondary | ICD-10-CM | POA: Diagnosis not present

## 2020-01-06 DIAGNOSIS — E1140 Type 2 diabetes mellitus with diabetic neuropathy, unspecified: Secondary | ICD-10-CM | POA: Diagnosis not present

## 2020-01-06 DIAGNOSIS — E1159 Type 2 diabetes mellitus with other circulatory complications: Secondary | ICD-10-CM | POA: Diagnosis not present

## 2020-01-06 DIAGNOSIS — Z79899 Other long term (current) drug therapy: Secondary | ICD-10-CM | POA: Diagnosis not present

## 2020-01-06 DIAGNOSIS — M47816 Spondylosis without myelopathy or radiculopathy, lumbar region: Secondary | ICD-10-CM | POA: Diagnosis not present

## 2020-01-06 DIAGNOSIS — M5136 Other intervertebral disc degeneration, lumbar region: Secondary | ICD-10-CM | POA: Diagnosis not present

## 2020-01-06 DIAGNOSIS — Z981 Arthrodesis status: Secondary | ICD-10-CM | POA: Diagnosis not present

## 2020-01-08 IMAGING — XA DG MYELOGRAPHY LUMBAR INJ LUMBOSACRAL
13 of 18 series · 13 of 18 positions shown · non-contrast
Comparison: MRI of the lumbar spine 02/28/2017. CT abdomen and
pelvis 06/22/2015.

CLINICAL DATA: Paresthesias extending into the left lower
extremity. Loss of control of the left lower extremity. Bilateral
hamstring tendinopathy with partial tear on the left.
TECHNIQUE: Contiguous axial images were obtained through the Lumbar spine after
the intrathecal infusion of infusion. Coronal and sagittal
reconstructions were obtained of the axial image sets.

[Series 1: w lumbar spine lat · 0.15mm/px · 1 of 1 slices shown]
[im 1/1]
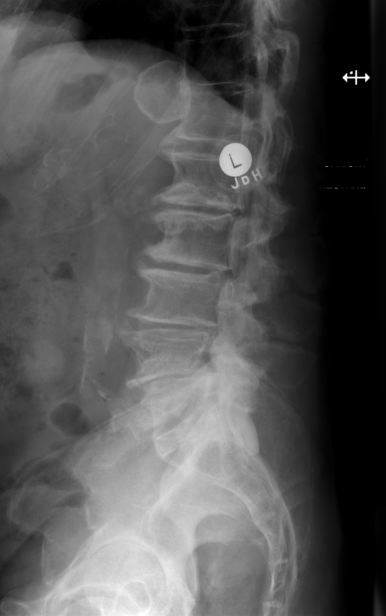

[Series 2: vasc adipose · 1 of 1 slices shown (1 of 11)]
[im 1/1]
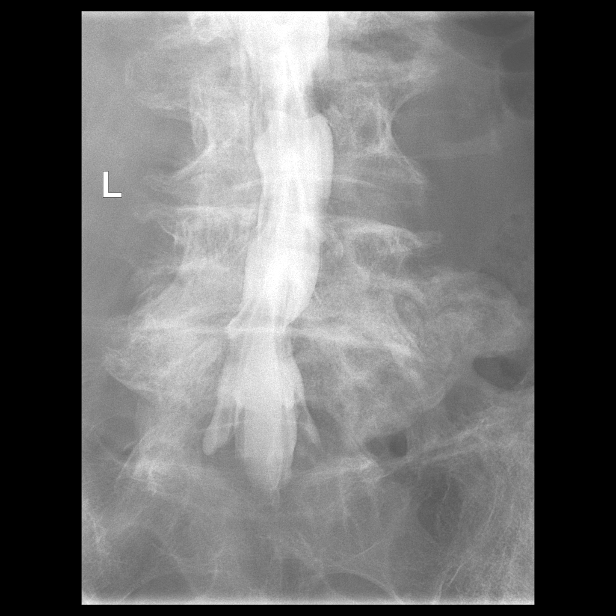

[Series 2: w lumbar spine flexion · 0.15mm/px · 1 of 1 slices shown]
[im 1/1]
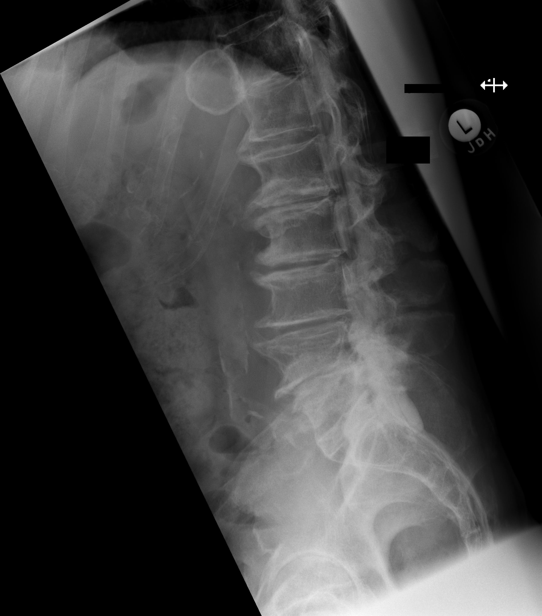

[Series 3: vasc adipose · 1 of 1 slices shown (2 of 11)]
[im 1/1]
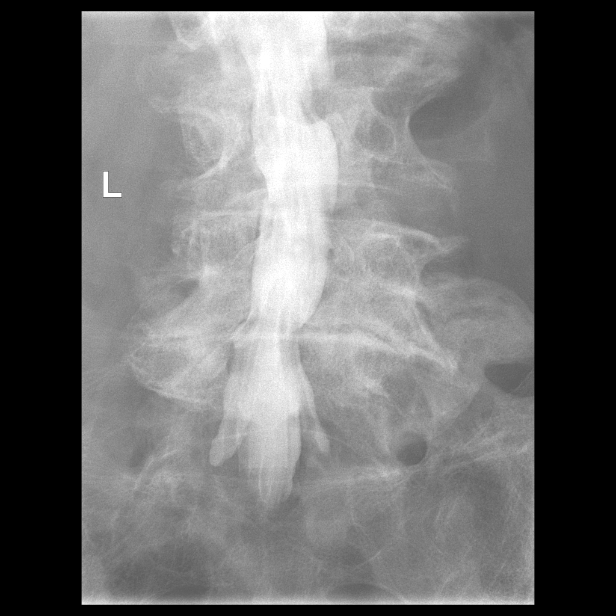

[Series 4: vasc adipose · 1 of 1 slices shown (3 of 11)]
[im 1/1]
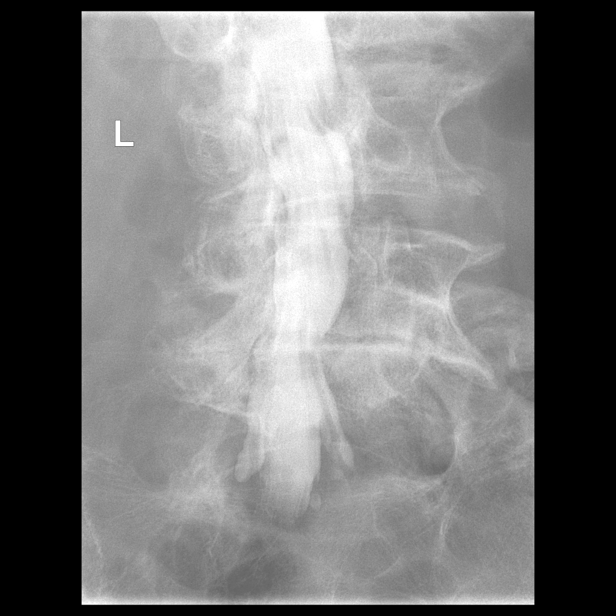

[Series 5: vasc adipose · 1 of 1 slices shown (4 of 11)]
[im 1/1]
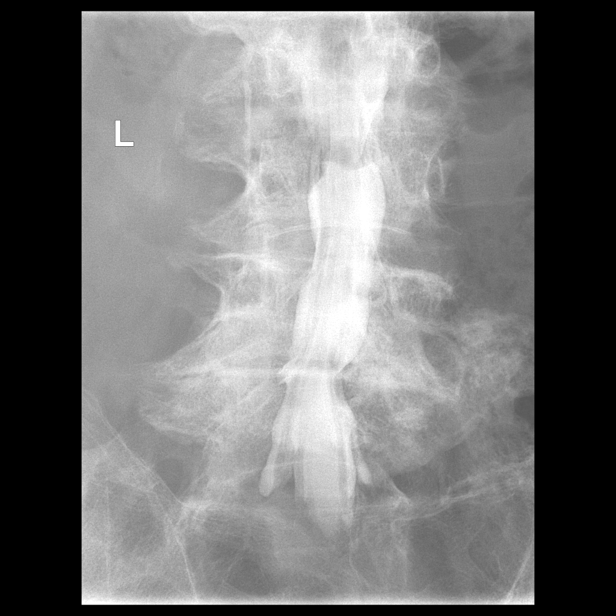

[Series 7: vasc adipose · 1 of 1 slices shown (5 of 11)]
[im 1/1]
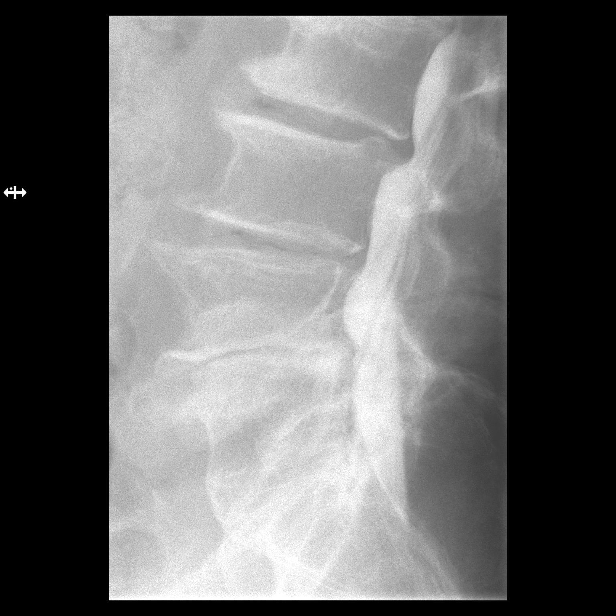

[Series 8: vasc adipose · 1 of 1 slices shown (6 of 11)]
[im 1/1]
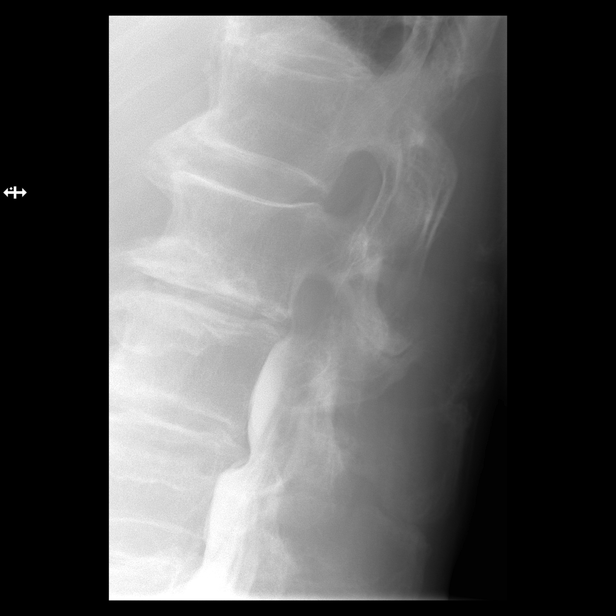

[Series 9: vasc adipose · 1 of 1 slices shown (7 of 11)]
[im 1/1]
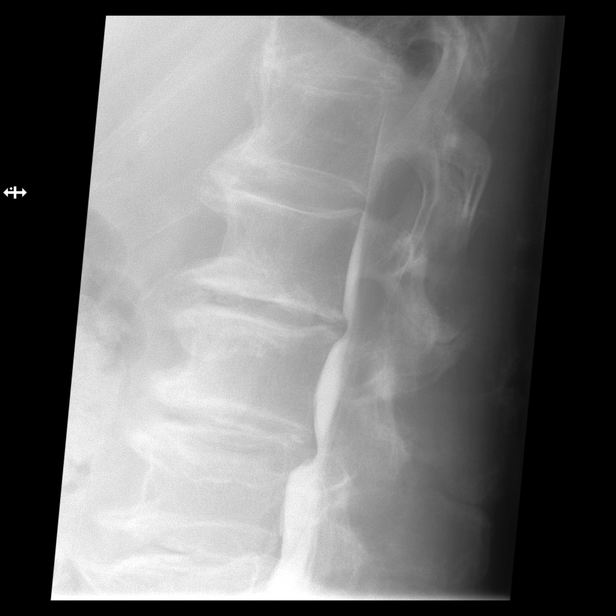

[Series 11: vasc adipose · 1 of 1 slices shown (8 of 11)]
[im 1/1]
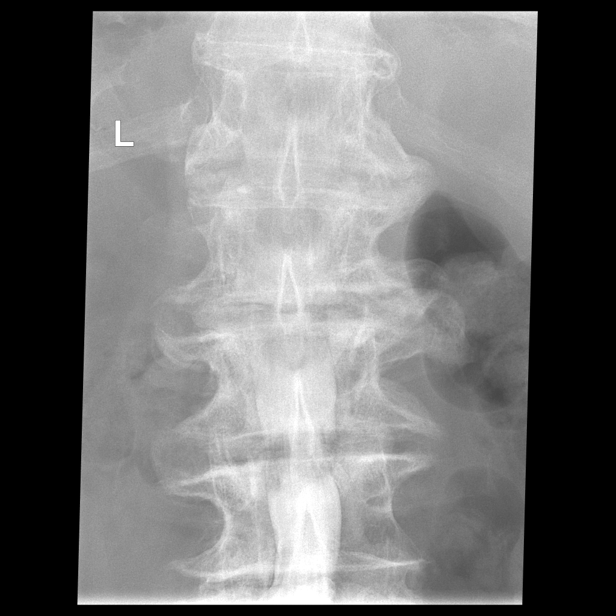

[Series 12: vasc adipose · 1 of 1 slices shown (9 of 11)]
[im 1/1]
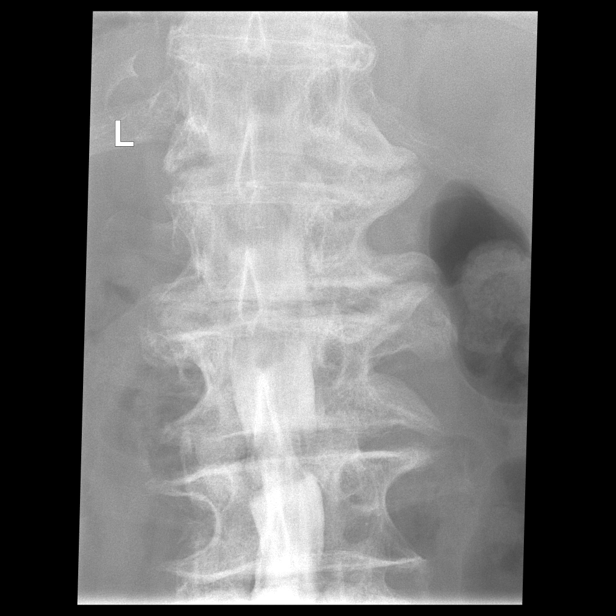

[Series 13: vasc adipose · 1 of 1 slices shown (10 of 11)]
[im 1/1]
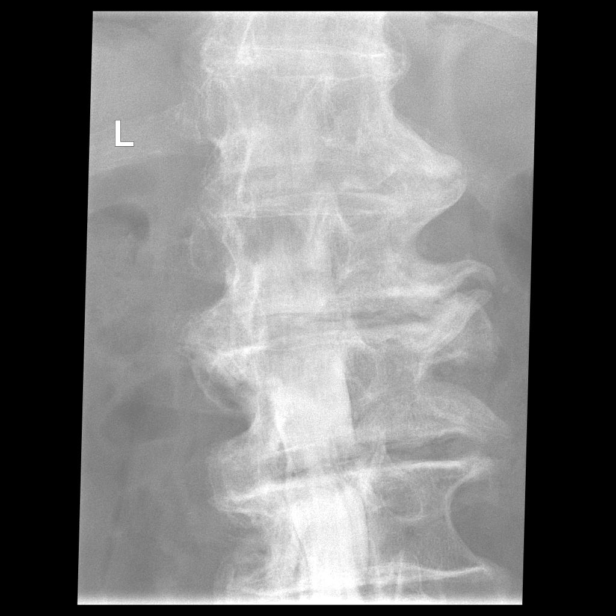

[Series 15: vasc adipose · 1 of 1 slices shown (11 of 11)]
[im 1/1]
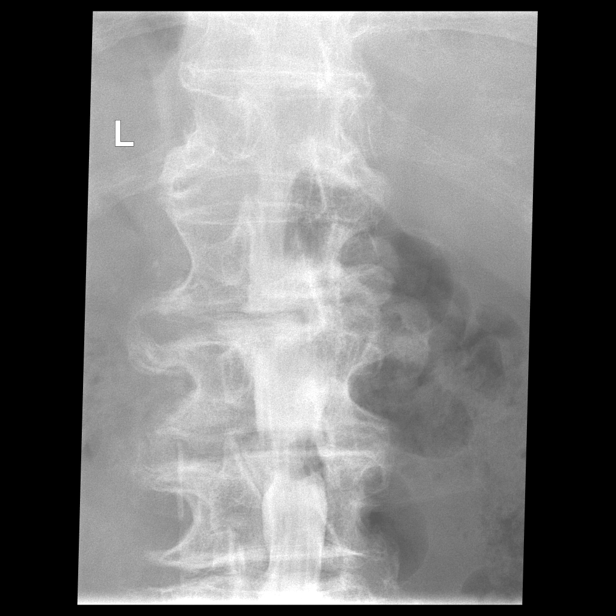

[13 of 18 positions shown; findings below may reference images not displayed]

EXAM:
LUMBAR MYELOGRAM

FLUOROSCOPY TIME:  Radiation Exposure Index (as provided by the
fluoroscopic device): 270.92 uGy*m2

Fluoroscopy Time:  44 seconds

Number of Acquired Images:  17

PROCEDURE:
After thorough discussion of risks and benefits of the procedure
including bleeding, infection, injury to nerves, blood vessels,
adjacent structures as well as headache and CSF leak, written and
oral informed consent was obtained. Consent was obtained by Dr.
Berto Coll. Time out form was completed.

Patient was positioned prone on the fluoroscopy table. Local
anesthesia was provided with 1% lidocaine without epinephrine after
prepped and draped in the usual sterile fashion. Puncture was
performed at L3-4 using a 3 1/2 inch 22-gauge spinal needle via
right paramedian approach. Using a single pass through the dura, the
needle was placed within the thecal sac, with return of clear CSF.
15 mL of Isovue M-ZXX was injected into the thecal sac, with normal
opacification of the nerve roots and cauda equina consistent with
free flow within the subarachnoid space.

I personally performed the lumbar puncture and administered the
intrathecal contrast. I also personally supervised acquisition of
the myelogram images.
FINDINGS: LUMBAR MYELOGRAM FINDINGS:

A rightward disc protrusion associated with chronic loss of disc
height is present at L4-5. This results in mild right subarticular
narrowing. Grade 1 anterolisthesis is present at L4-5. There is
slight retrolisthesis at L3-4. More prominent broad-based disc
protrusion is present at L2-3 moderate subarticular narrowing
bilaterally.

The disc protrusions at L1-2 and L2-3 are worse with standing. There
is no significant change in the retrolisthesis at L3-4 or
anterolisthesis at L4-5 with flexion or extension.

CT LUMBAR MYELOGRAM FINDINGS:

Five non rib-bearing lumbar type vertebral bodies are present. The
lumbar spine is imaged from T11-12 through S2-3. There is a
transitional L5 segment with fusion of the transverse process to the
sacrum bilaterally.

Atherosclerotic calcifications are present in the aorta and branch
vessels without significant aneurysm. There is no significant
adenopathy in the visualized abdomen.

The conus medullaris terminates at T12-L1, within normal limits.

L1-2: A broad-based disc protrusion is present. Moderate facet
hypertrophy is noted bilaterally. Right subarticular narrowing is
present. The foramina are patent bilaterally.

L2-3: Slight retrolisthesis is present. A left laminectomy is noted.
The posterior canal is decompressed. Residual broad-based disc
protrusion and bilateral facet hypertrophy contribute to moderate
right and mild left subarticular narrowing. Mild foraminal narrowing
is present bilaterally.

L3-4: Retrolisthesis is present. There is uncovering of a
broad-based disc protrusion. The posterior canal is decompressed. No
significant central canal stenosis is present. Moderate foraminal
narrowing is worse left than right.

L4-5: Advanced right-sided facet hypertrophy and spurring is noted.
Posterior canal is decompressed via a left laminectomy. Severe right
and moderate left subarticular narrowing is present. Severe
foraminal narrowing is worse on the right.

L5-S1: No focal disc protrusion or stenosis is present.
IMPRESSION: 1. Severe right and moderate left subarticular stenosis is present
at L4-5.
2. Severe foraminal narrowing bilaterally at L4-5 is worse on the
right.
3. Postsurgical changes at L3-4 without residual or recurrent
central canal stenosis.
4. Moderate foraminal narrowing at L3-4 is worse on the left.
5. Postsurgical changes at L2-3 with decompression of the posterior
canal but residual broad-based disc protrusion and bilateral facet
hypertrophy contributing to moderate right and mild left
subarticular stenosis at L2-3.
6. Mild bilateral foraminal narrowing at L2-3.
7. Right subarticular narrowing at L1-2 without significant
foraminal stenosis.

## 2020-02-22 IMAGING — RF DG C-ARM 61-120 MIN
1 series · 2 of 2 positions shown · non-contrast
Comparison: 06/11/2017

CLINICAL DATA: L2-3 PLIF

EXAM:
LUMBAR SPINE - 2-3 VIEW; DG C-ARM 61-120 MIN

[Series 1: run · 2 of 2 slices shown]
[im 1/2]
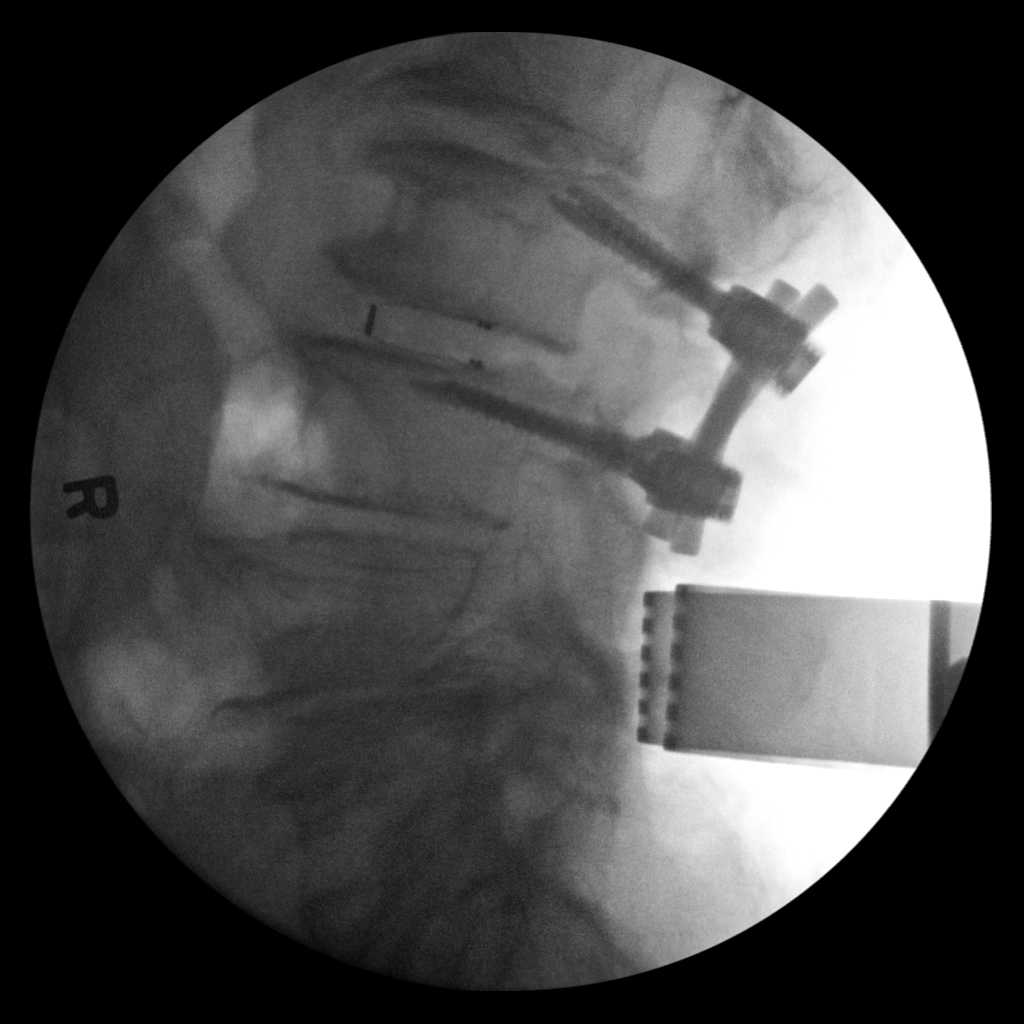
[im 2/2]
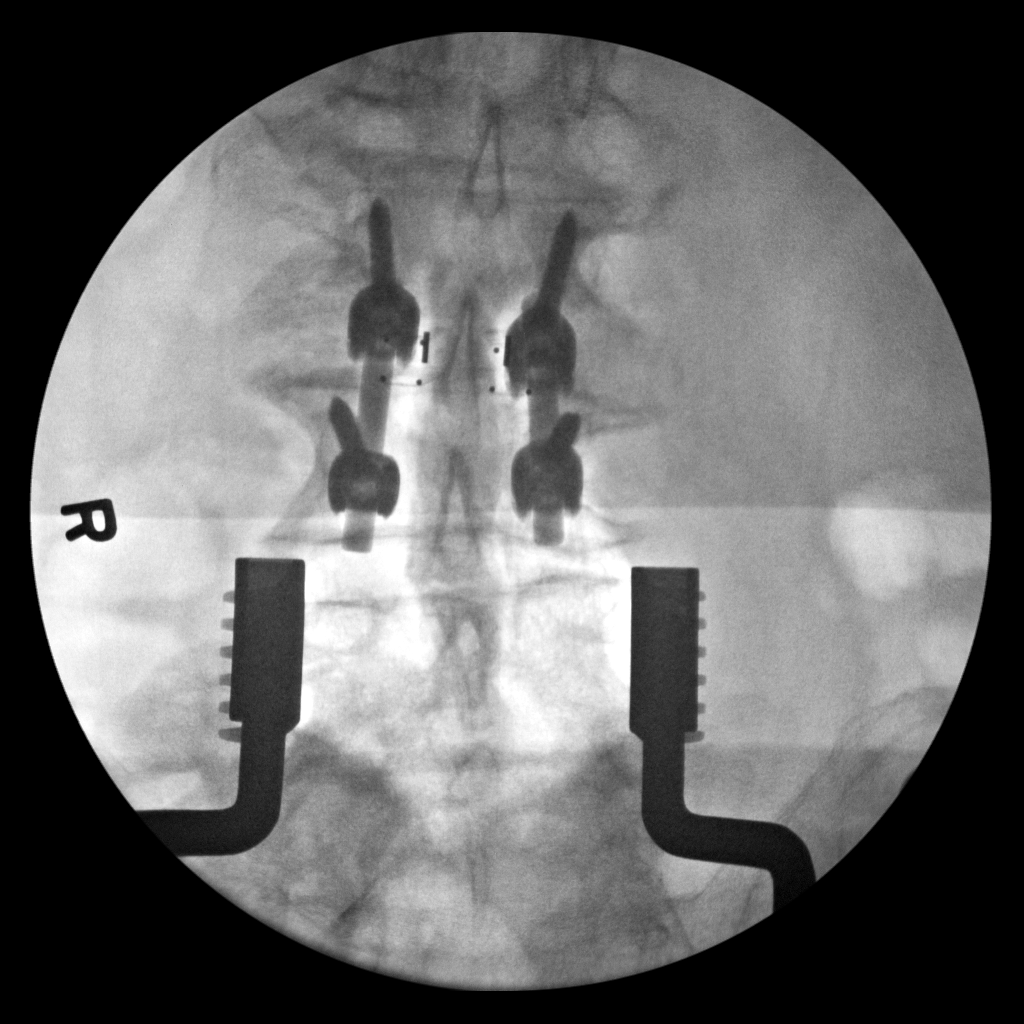

[2 of 2 positions shown; findings below may reference images not displayed]

FLUOROSCOPY TIME:  Fluoroscopy Time:  1 minutes 2 seconds

Radiation Exposure Index (if provided by the fluoroscopic device):
Not available

Number of Acquired Spot Images: 2
FINDINGS: Images show pedicle screws at L2 and L3 with interbody fusion and
posterior fixation.
IMPRESSION: L2-3 fusion.

## 2020-02-22 IMAGING — CR DG CHEST 2V
2 series · 2 of 2 positions shown · non-contrast
Comparison: 05/31/2016

CLINICAL DATA: Preoperative evaluation for upcoming lumbar surgery

EXAM:
CHEST - 2 VIEW

[w chest lat]
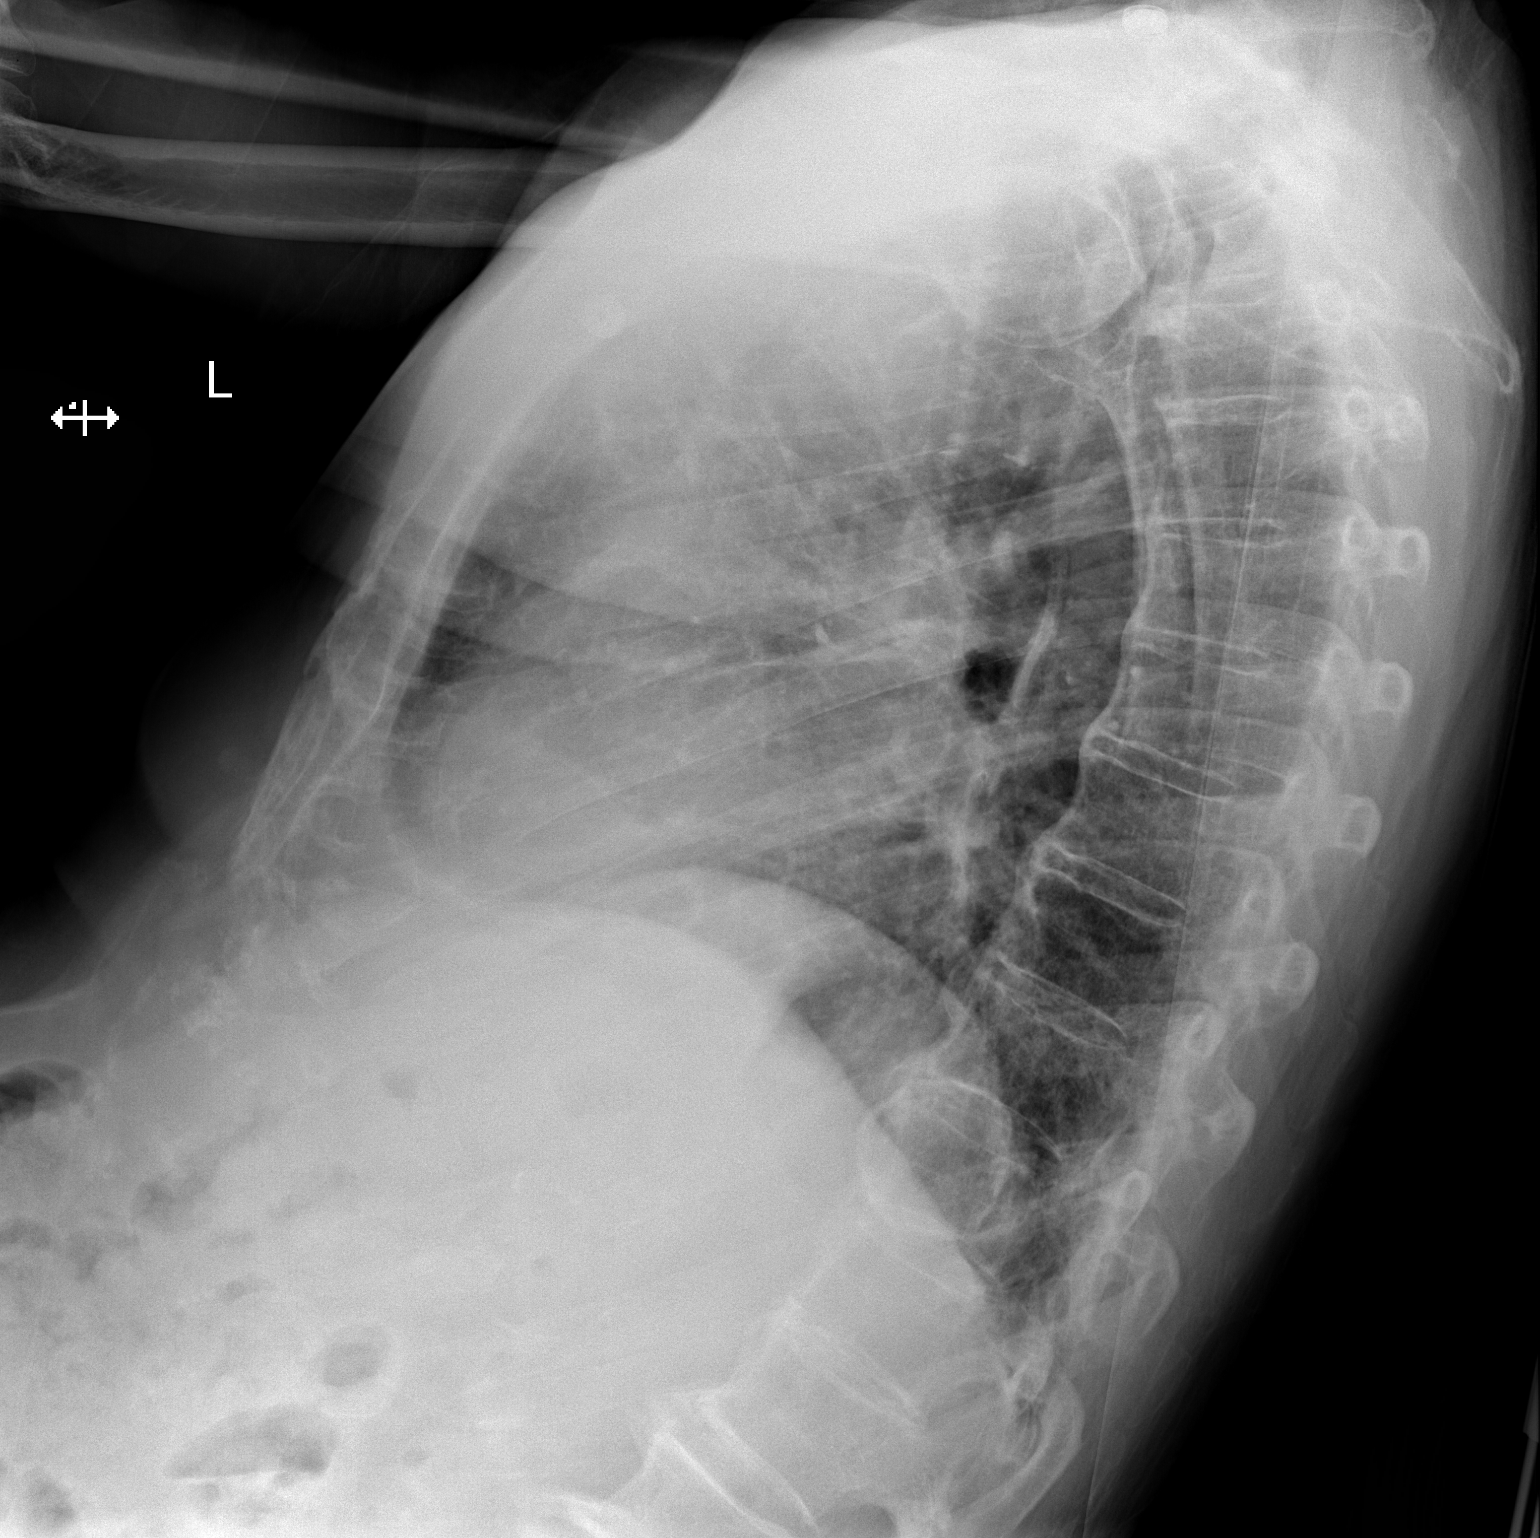

[x chest ap]
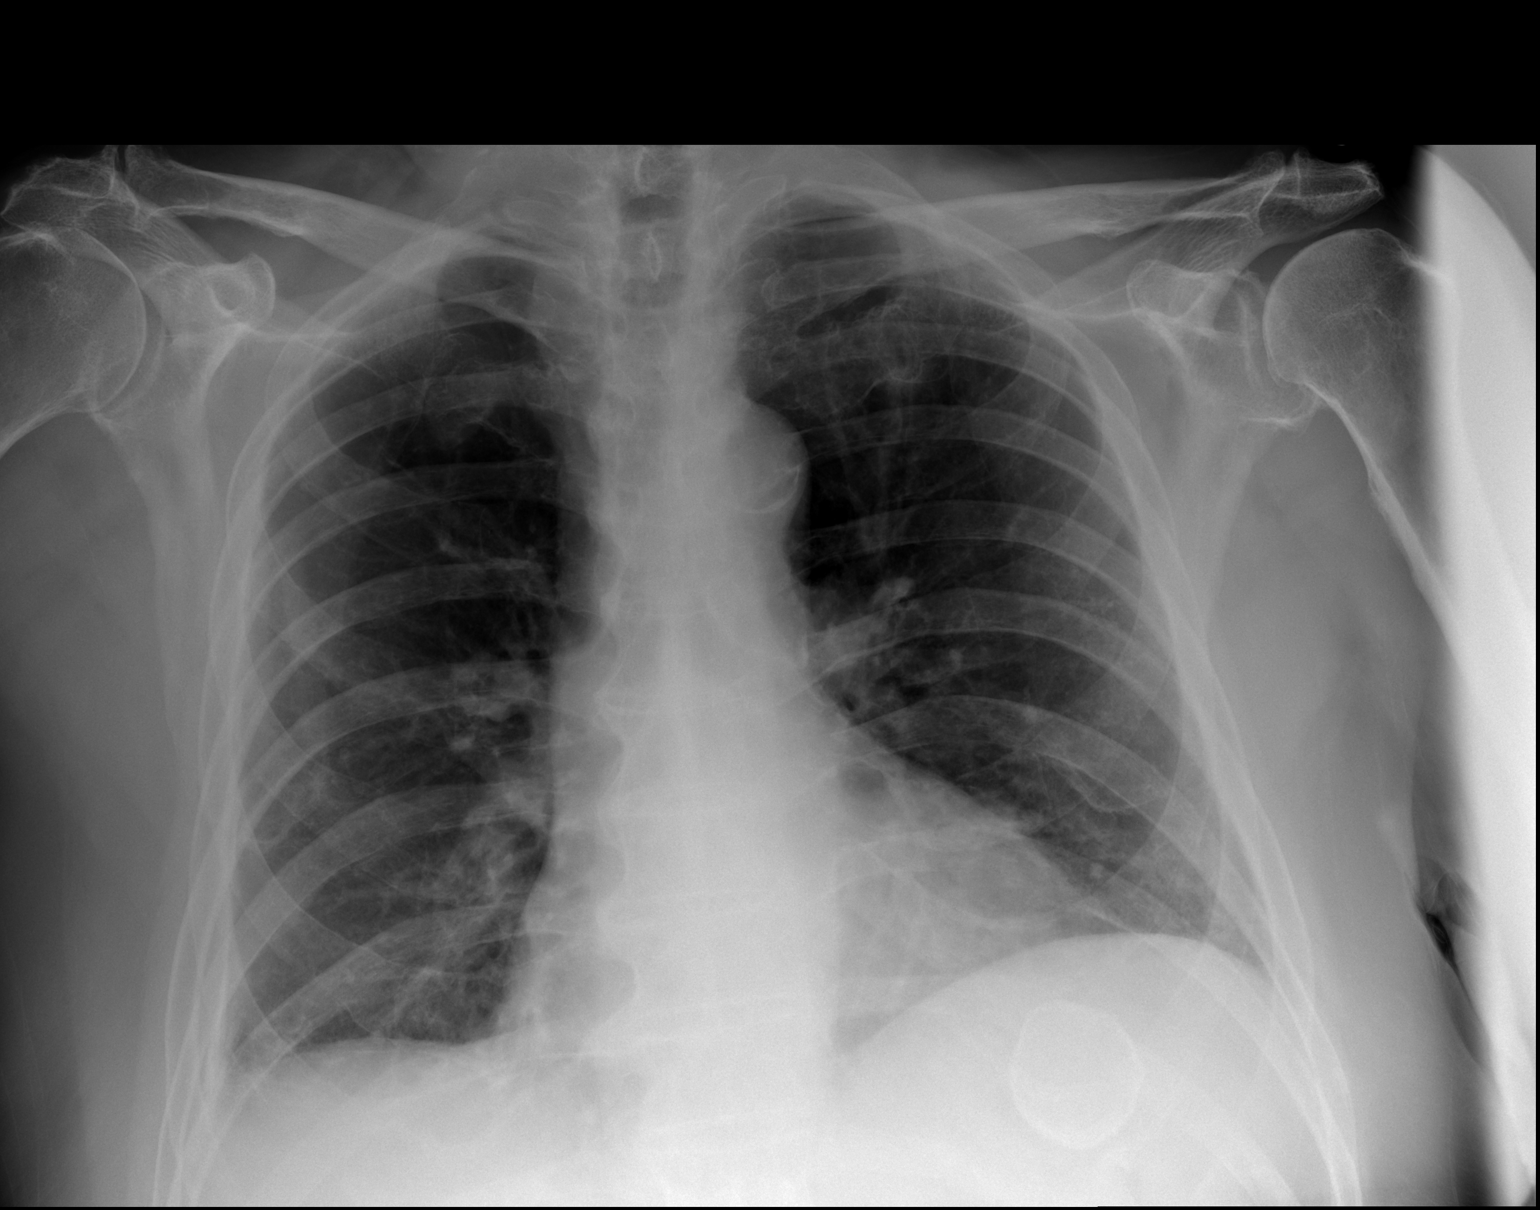

[2 of 2 positions shown; findings below may reference images not displayed]

FINDINGS: Cardiac shadow is within normal limits. The lungs are well aerated
bilaterally. No focal infiltrate or sizable effusion is seen.
Chronic blunting of the right costophrenic angle is noted. Calcified
splenic cyst is noted and stable. No acute bony abnormality is seen.
IMPRESSION: No acute abnormality noted.  Chronic changes as described.

## 2020-04-01 DIAGNOSIS — K219 Gastro-esophageal reflux disease without esophagitis: Secondary | ICD-10-CM | POA: Diagnosis not present

## 2020-04-01 DIAGNOSIS — I152 Hypertension secondary to endocrine disorders: Secondary | ICD-10-CM | POA: Diagnosis not present

## 2020-04-01 DIAGNOSIS — Z6828 Body mass index (BMI) 28.0-28.9, adult: Secondary | ICD-10-CM | POA: Diagnosis not present

## 2020-04-01 DIAGNOSIS — I10 Essential (primary) hypertension: Secondary | ICD-10-CM | POA: Diagnosis not present

## 2020-04-01 DIAGNOSIS — E119 Type 2 diabetes mellitus without complications: Secondary | ICD-10-CM | POA: Diagnosis not present

## 2020-04-01 DIAGNOSIS — E871 Hypo-osmolality and hyponatremia: Secondary | ICD-10-CM | POA: Diagnosis not present

## 2020-04-01 DIAGNOSIS — Z20822 Contact with and (suspected) exposure to covid-19: Secondary | ICD-10-CM | POA: Diagnosis not present

## 2020-04-01 DIAGNOSIS — Z7984 Long term (current) use of oral hypoglycemic drugs: Secondary | ICD-10-CM | POA: Diagnosis not present

## 2020-04-01 DIAGNOSIS — E663 Overweight: Secondary | ICD-10-CM | POA: Diagnosis not present

## 2020-04-01 DIAGNOSIS — E861 Hypovolemia: Secondary | ICD-10-CM | POA: Diagnosis not present

## 2020-04-01 DIAGNOSIS — F17220 Nicotine dependence, chewing tobacco, uncomplicated: Secondary | ICD-10-CM | POA: Diagnosis not present

## 2020-04-01 DIAGNOSIS — E1159 Type 2 diabetes mellitus with other circulatory complications: Secondary | ICD-10-CM | POA: Diagnosis not present

## 2020-04-01 DIAGNOSIS — R197 Diarrhea, unspecified: Secondary | ICD-10-CM | POA: Diagnosis not present

## 2020-04-02 DIAGNOSIS — R197 Diarrhea, unspecified: Secondary | ICD-10-CM | POA: Diagnosis not present

## 2020-04-02 DIAGNOSIS — E871 Hypo-osmolality and hyponatremia: Secondary | ICD-10-CM | POA: Diagnosis not present

## 2020-04-15 DIAGNOSIS — R197 Diarrhea, unspecified: Secondary | ICD-10-CM | POA: Diagnosis not present

## 2020-04-15 DIAGNOSIS — E871 Hypo-osmolality and hyponatremia: Secondary | ICD-10-CM | POA: Diagnosis not present

## 2020-04-29 DIAGNOSIS — E1169 Type 2 diabetes mellitus with other specified complication: Secondary | ICD-10-CM | POA: Diagnosis not present

## 2020-04-29 DIAGNOSIS — E785 Hyperlipidemia, unspecified: Secondary | ICD-10-CM | POA: Diagnosis not present

## 2020-04-29 DIAGNOSIS — I152 Hypertension secondary to endocrine disorders: Secondary | ICD-10-CM | POA: Diagnosis not present

## 2020-04-29 DIAGNOSIS — I4892 Unspecified atrial flutter: Secondary | ICD-10-CM | POA: Diagnosis not present

## 2020-04-29 DIAGNOSIS — E1159 Type 2 diabetes mellitus with other circulatory complications: Secondary | ICD-10-CM | POA: Diagnosis not present

## 2020-04-30 DIAGNOSIS — I440 Atrioventricular block, first degree: Secondary | ICD-10-CM | POA: Diagnosis not present

## 2020-05-04 DIAGNOSIS — Z981 Arthrodesis status: Secondary | ICD-10-CM | POA: Diagnosis not present

## 2020-05-04 DIAGNOSIS — M47816 Spondylosis without myelopathy or radiculopathy, lumbar region: Secondary | ICD-10-CM | POA: Diagnosis not present

## 2020-05-04 DIAGNOSIS — M545 Low back pain, unspecified: Secondary | ICD-10-CM | POA: Diagnosis not present

## 2020-05-04 DIAGNOSIS — G8929 Other chronic pain: Secondary | ICD-10-CM | POA: Diagnosis not present

## 2020-05-18 DIAGNOSIS — M47816 Spondylosis without myelopathy or radiculopathy, lumbar region: Secondary | ICD-10-CM | POA: Diagnosis not present

## 2020-07-20 DIAGNOSIS — E1165 Type 2 diabetes mellitus with hyperglycemia: Secondary | ICD-10-CM | POA: Diagnosis not present

## 2020-07-20 DIAGNOSIS — W57XXXA Bitten or stung by nonvenomous insect and other nonvenomous arthropods, initial encounter: Secondary | ICD-10-CM | POA: Diagnosis not present

## 2020-07-20 DIAGNOSIS — E785 Hyperlipidemia, unspecified: Secondary | ICD-10-CM | POA: Diagnosis not present

## 2020-07-20 DIAGNOSIS — I152 Hypertension secondary to endocrine disorders: Secondary | ICD-10-CM | POA: Diagnosis not present

## 2020-07-20 DIAGNOSIS — Z8639 Personal history of other endocrine, nutritional and metabolic disease: Secondary | ICD-10-CM | POA: Diagnosis not present

## 2020-07-20 DIAGNOSIS — N289 Disorder of kidney and ureter, unspecified: Secondary | ICD-10-CM | POA: Diagnosis not present

## 2020-07-20 DIAGNOSIS — E1169 Type 2 diabetes mellitus with other specified complication: Secondary | ICD-10-CM | POA: Diagnosis not present

## 2020-07-20 DIAGNOSIS — E1159 Type 2 diabetes mellitus with other circulatory complications: Secondary | ICD-10-CM | POA: Diagnosis not present

## 2020-10-06 DIAGNOSIS — H2513 Age-related nuclear cataract, bilateral: Secondary | ICD-10-CM | POA: Diagnosis not present

## 2020-10-06 DIAGNOSIS — H25013 Cortical age-related cataract, bilateral: Secondary | ICD-10-CM | POA: Diagnosis not present

## 2020-10-06 DIAGNOSIS — H5203 Hypermetropia, bilateral: Secondary | ICD-10-CM | POA: Diagnosis not present

## 2020-10-06 DIAGNOSIS — H524 Presbyopia: Secondary | ICD-10-CM | POA: Diagnosis not present

## 2020-10-06 DIAGNOSIS — H35363 Drusen (degenerative) of macula, bilateral: Secondary | ICD-10-CM | POA: Diagnosis not present

## 2020-10-06 DIAGNOSIS — H0100B Unspecified blepharitis left eye, upper and lower eyelids: Secondary | ICD-10-CM | POA: Diagnosis not present

## 2020-10-06 DIAGNOSIS — H0100A Unspecified blepharitis right eye, upper and lower eyelids: Secondary | ICD-10-CM | POA: Diagnosis not present

## 2020-10-06 DIAGNOSIS — H52203 Unspecified astigmatism, bilateral: Secondary | ICD-10-CM | POA: Diagnosis not present

## 2020-10-06 DIAGNOSIS — E119 Type 2 diabetes mellitus without complications: Secondary | ICD-10-CM | POA: Diagnosis not present

## 2020-10-06 DIAGNOSIS — H02831 Dermatochalasis of right upper eyelid: Secondary | ICD-10-CM | POA: Diagnosis not present

## 2020-10-06 DIAGNOSIS — Z7984 Long term (current) use of oral hypoglycemic drugs: Secondary | ICD-10-CM | POA: Diagnosis not present

## 2020-10-06 DIAGNOSIS — H02834 Dermatochalasis of left upper eyelid: Secondary | ICD-10-CM | POA: Diagnosis not present

## 2020-10-07 DIAGNOSIS — M25552 Pain in left hip: Secondary | ICD-10-CM | POA: Diagnosis not present

## 2020-10-21 DIAGNOSIS — M1711 Unilateral primary osteoarthritis, right knee: Secondary | ICD-10-CM | POA: Diagnosis not present

## 2020-11-01 DIAGNOSIS — R7989 Other specified abnormal findings of blood chemistry: Secondary | ICD-10-CM | POA: Diagnosis not present

## 2020-12-09 DIAGNOSIS — M1711 Unilateral primary osteoarthritis, right knee: Secondary | ICD-10-CM | POA: Diagnosis not present

## 2020-12-11 DIAGNOSIS — M1711 Unilateral primary osteoarthritis, right knee: Secondary | ICD-10-CM | POA: Diagnosis not present

## 2020-12-20 DIAGNOSIS — Z13820 Encounter for screening for osteoporosis: Secondary | ICD-10-CM | POA: Diagnosis not present

## 2020-12-20 DIAGNOSIS — M810 Age-related osteoporosis without current pathological fracture: Secondary | ICD-10-CM | POA: Diagnosis not present

## 2020-12-31 DIAGNOSIS — R7989 Other specified abnormal findings of blood chemistry: Secondary | ICD-10-CM | POA: Diagnosis not present
